# Patient Record
Sex: Female | Born: 1965 | Race: Black or African American | Hispanic: Yes | Smoking: Never smoker
Health system: Southern US, Community
[De-identification: ages and names within clinical notes are randomized; demographics above are authoritative.]

## PROBLEM LIST (undated history)

## (undated) DIAGNOSIS — D259 Leiomyoma of uterus, unspecified: Secondary | ICD-10-CM

## (undated) DIAGNOSIS — U071 COVID-19: Secondary | ICD-10-CM

## (undated) DIAGNOSIS — I4891 Unspecified atrial fibrillation: Secondary | ICD-10-CM

## (undated) DIAGNOSIS — D219 Benign neoplasm of connective and other soft tissue, unspecified: Secondary | ICD-10-CM

## (undated) DIAGNOSIS — Z8619 Personal history of other infectious and parasitic diseases: Secondary | ICD-10-CM

## (undated) HISTORY — PX: DIAGNOSTIC LAPAROSCOPY: SUR761

## (undated) HISTORY — DX: Personal history of other infectious and parasitic diseases: Z86.19

## (undated) HISTORY — PX: GANGLION CYST EXCISION: SHX1691

## (undated) HISTORY — DX: Benign neoplasm of connective and other soft tissue, unspecified: D21.9

## (undated) HISTORY — DX: Leiomyoma of uterus, unspecified: D25.9

---
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
## 1999-04-13 HISTORY — PX: MYOMECTOMY: SHX85

## 1999-04-13 HISTORY — PX: MYOMECTOMY ABDOMINAL APPROACH: SUR870

## 2000-04-12 HISTORY — PX: UTERINE FIBROID SURGERY: SHX826

## 2010-04-07 ENCOUNTER — Ambulatory Visit: Payer: Self-pay | Admitting: Psychology

## 2010-04-21 ENCOUNTER — Ambulatory Visit
Admission: RE | Admit: 2010-04-21 | Discharge: 2010-04-21 | Payer: Self-pay | Source: Home / Self Care | Attending: Psychology | Admitting: Psychology

## 2010-05-05 ENCOUNTER — Ambulatory Visit: Admit: 2010-05-05 | Payer: Self-pay | Admitting: Psychology

## 2010-05-19 ENCOUNTER — Ambulatory Visit (INDEPENDENT_AMBULATORY_CARE_PROVIDER_SITE_OTHER): Payer: BC Managed Care – PPO | Admitting: Psychology

## 2010-05-19 DIAGNOSIS — F4323 Adjustment disorder with mixed anxiety and depressed mood: Secondary | ICD-10-CM

## 2010-06-02 ENCOUNTER — Ambulatory Visit (INDEPENDENT_AMBULATORY_CARE_PROVIDER_SITE_OTHER): Payer: BC Managed Care – PPO | Admitting: Psychology

## 2010-06-02 DIAGNOSIS — F4323 Adjustment disorder with mixed anxiety and depressed mood: Secondary | ICD-10-CM

## 2010-06-16 ENCOUNTER — Ambulatory Visit (INDEPENDENT_AMBULATORY_CARE_PROVIDER_SITE_OTHER): Payer: BC Managed Care – PPO | Admitting: Psychology

## 2010-06-16 DIAGNOSIS — F4323 Adjustment disorder with mixed anxiety and depressed mood: Secondary | ICD-10-CM

## 2010-06-30 ENCOUNTER — Ambulatory Visit (INDEPENDENT_AMBULATORY_CARE_PROVIDER_SITE_OTHER): Payer: BC Managed Care – PPO | Admitting: Psychology

## 2010-06-30 DIAGNOSIS — F4323 Adjustment disorder with mixed anxiety and depressed mood: Secondary | ICD-10-CM

## 2010-09-09 ENCOUNTER — Encounter (INDEPENDENT_AMBULATORY_CARE_PROVIDER_SITE_OTHER): Payer: BC Managed Care – PPO | Admitting: Obstetrics & Gynecology

## 2010-09-09 DIAGNOSIS — Z01419 Encounter for gynecological examination (general) (routine) without abnormal findings: Secondary | ICD-10-CM

## 2010-09-09 DIAGNOSIS — Z3009 Encounter for other general counseling and advice on contraception: Secondary | ICD-10-CM

## 2010-09-09 DIAGNOSIS — Z1272 Encounter for screening for malignant neoplasm of vagina: Secondary | ICD-10-CM

## 2010-09-10 NOTE — Assessment & Plan Note (Signed)
[name], [name]:  [id number]  MEDICAL RECORD NO.:  [id number]           PATIENT TYPE:  LOCATION:  [facility] at [facility]           FACILITY:  PHYSICIAN:  Catalina Antigua, MD          DATE OF BIRTH:  DATE OF SERVICE:  09/09/2010                                 CLINIC NOTE  This is a 45 year old G1, P14 with LMP of Sep 04, 2010, who presents today for annual exam.  The patient is currently without any complaints. Denies abnormal bleeding or pelvic pain.  The patient recently relocated from [place] and interested in initiation of a birth control option. She is currently using condoms for birth control.  PAST MEDICAL HISTORY:  She denies.  PAST SURGICAL HISTORY:  She has had 1 C-section and an abdominal myomectomy in 2001.  OBSTETRICAL HISTORY:  She has had one full-term C-section.  PAST GYNECOLOGIC HISTORY:  She denies any abnormal Pap smears.  She does have a history of fibroid uterus treated with myomectomy.  FAMILY HISTORY:  Significant for hypertension in a grandmother who is deceased with an unknown type of cancer.  SOCIAL HISTORY:  She denies drinking, smoking, or use of illicit drugs.  REVIEW OF SYSTEMS:  Otherwise within normal limits.  PHYSICAL EXAMINATION:  VITAL SIGNS:  Her blood pressure is 122/87, pulse of 73, weight of 233 pounds, height of 5 feet 10 inches. LUNGS:  Clear to auscultation bilaterally. HEART:  Regular rate and rhythm. BREASTS:  Nontender, equal in size.  No palpable masses or lymphadenopathy.  No expressible nipple discharge. ABDOMEN:  Soft, nontender, nondistended, slightly obese. PELVIC:  External genitalia, normal-appearing vaginal mucosa and cervix. No abnormal bleeding or discharge.  She had an approximately 10-week size anteverted uterus.  No palpable adnexal masses or tenderness.  ASSESSMENT AND PLAN:  This is a 45 year old G1, P1 who presents today for annual exam and discussion of birth control options.  Pap  smear was performed today.  After counseling of the different birth control options, the patient opted for initiation of birth control pills that she was taking many years ago prior to her marriage and a prescription for Cyclessa was therefore provided.  The patient has no contraindication for birth control pills.  The patient is also contemplating the use of Mirena IUD.  The patient will therefore return in a year or p.r.n. and may contemplate Mirena IUD insertion at her next visit. The patient will also be contacted with any abnormal results.          ______________________________ Catalina Antigua, MD    PC/MEDQ  D:  09/09/2010  T:  09/09/2010  Job:  [id number]

## 2011-07-05 ENCOUNTER — Ambulatory Visit: Payer: BC Managed Care – PPO | Admitting: Family Medicine

## 2011-07-12 ENCOUNTER — Ambulatory Visit (INDEPENDENT_AMBULATORY_CARE_PROVIDER_SITE_OTHER): Payer: No Typology Code available for payment source | Admitting: Family Medicine

## 2011-07-12 ENCOUNTER — Encounter: Payer: Self-pay | Admitting: Family Medicine

## 2011-07-12 VITALS — BP 132/82 | HR 72 | Temp 98.8°F | Ht 70.0 in | Wt 222.2 lb

## 2011-07-12 DIAGNOSIS — Z0000 Encounter for general adult medical examination without abnormal findings: Secondary | ICD-10-CM | POA: Insufficient documentation

## 2011-07-12 DIAGNOSIS — Z0001 Encounter for general adult medical examination with abnormal findings: Secondary | ICD-10-CM | POA: Insufficient documentation

## 2011-07-12 LAB — LIPID PANEL
HDL: 60.7 mg/dL (ref >39.00)
Total CHOL/HDL Ratio: 4
VLDL: 16 mg/dL (ref 0.0–40.0)

## 2011-07-12 LAB — BASIC METABOLIC PANEL
Calcium: 9.2 mg/dL (ref 8.4–10.5)
GFR: 86.29 mL/min (ref >60.00)
Glucose, Bld: 82 mg/dL (ref 70–99)
Potassium: 4.1 mEq/L (ref 3.5–5.1)
Sodium: 139 mEq/L (ref 135–145)

## 2011-07-12 LAB — TSH: TSH: 0.98 u[IU]/mL (ref 0.35–5.50)

## 2011-07-12 LAB — LDL CHOLESTEROL, DIRECT: Direct LDL: 150.1 mg/dL

## 2011-07-12 NOTE — Progress Notes (Signed)
Subjective:    Patient ID: Breanna Weber, female    DOB: 01-25-1966, 46 y.o.   MRN: [id number]  HPI CC: new pt  Step-dad sick, attributes somewhat elevated bp to this.    Body mass index is 31.89 kg/(m^2).  LMP: 06/2011  Preventative: Last CPE 2011. Well woman exam - 2012, at [facility].  On OCPs. Normal paps and breast exam, normal mammograms. Flu shot - does not get. Tetanus - 2004. Fasting today. Seat belt 100% time.  Caffeine: none Lives with son Husband passed 2011. 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  Past histories reviewed and updated if relevant as below. There is no problem list on file for this patient.  Past Medical History  Diagnosis Date  . Uterine fibroid     removed  . History of chicken pox    Past Surgical History  Procedure Date  . Uterine fibroid surgery 2002  . Ganglion cyst excision x2   History  Substance Use Topics  . Smoking status: Never Smoker   . Smokeless tobacco: Never Used  . Alcohol Use: Yes     Occasional   Family History  Problem Relation Age of Onset  . Hypertension Mother   . Hypertension Maternal Grandmother   . Cancer Maternal Grandmother     ovarian cancer  . Stroke Maternal Grandmother   . Coronary artery disease Maternal Grandmother 65  . Diabetes Maternal Grandfather   . Hypertension Maternal Grandfather    Allergies  Allergen Reactions  . Aspirin Other (See Comments)    nosebleed   No current outpatient prescriptions on file prior to visit.    Review of Systems  Constitutional: Negative for fever, chills, activity change, appetite change, fatigue and unexpected weight change.  HENT: Negative for hearing loss and neck pain.   Eyes: Negative for visual disturbance.   Respiratory: Negative for cough, chest tightness, shortness of breath and wheezing.   Cardiovascular: Negative for chest pain, palpitations and leg swelling.  Gastrointestinal: Negative for nausea, vomiting, abdominal pain, diarrhea, constipation, blood in stool and abdominal distention.  Genitourinary: Negative for hematuria and difficulty urinating.  Musculoskeletal: Negative for myalgias and arthralgias.  Skin: Negative for rash.  Neurological: Negative for dizziness, seizures, syncope and headaches.  Hematological: Does not bruise/bleed easily.  Psychiatric/Behavioral: Negative for dysphoric mood. The patient is not nervous/anxious.        Objective:   Physical Exam  Nursing note and vitals reviewed. Constitutional: She is oriented to person, place, and time. She appears well-developed and well-nourished. No distress.  HENT:  Head: Normocephalic and atraumatic.  Right Ear: External ear normal.  Left Ear: External ear normal.  Nose: Nose normal.  Mouth/Throat: Oropharynx is clear and moist. No oropharyngeal exudate.  Eyes: Conjunctivae and EOM are normal. Pupils are equal, round, and reactive to light. No scleral icterus.  Neck: Normal range of motion. Neck supple. No thyromegaly present.  Cardiovascular: Normal rate, regular rhythm, normal heart sounds and intact distal pulses.   No murmur heard. Pulses:      Radial pulses are 2+ on the right side, and 2+ on the left side.  Pulmonary/Chest: Effort normal and breath sounds normal. No respiratory distress. She has no wheezes. She has no rales.  Abdominal: Soft. Bowel sounds are normal. She exhibits no distension and no mass. There  is no tenderness. There is no rebound and no guarding.  Musculoskeletal: Normal range of motion. She exhibits no edema.  Lymphadenopathy:    She has no cervical adenopathy.  Neurological: She is alert and oriented to person, place, and time.       CN grossly intact, station and gait intact  Skin: Skin  is warm and dry. No rash noted.  Psychiatric: She has a normal mood and affect. Her behavior is normal. Judgment and thought content normal.      Assessment & Plan:

## 2011-07-12 NOTE — Assessment & Plan Note (Signed)
Preventative protocols reviewed and updated unless pt declined. Tetanus next year. Fasting blood work today. Well woman with [facility]. rtc 2 years or as needed. Discussed healthy diet, living.

## 2011-07-12 NOTE — Patient Instructions (Signed)
Good to see you!  Call us with questions. Return in 2 years for next physical or as needed.

## 2011-08-13 ENCOUNTER — Other Ambulatory Visit: Payer: Self-pay | Admitting: Obstetrics and Gynecology

## 2011-10-19 ENCOUNTER — Ambulatory Visit (INDEPENDENT_AMBULATORY_CARE_PROVIDER_SITE_OTHER): Payer: No Typology Code available for payment source | Admitting: Family Medicine

## 2011-10-19 ENCOUNTER — Encounter: Payer: Self-pay | Admitting: Family Medicine

## 2011-10-19 VITALS — BP 123/84 | HR 90 | Ht 70.0 in | Wt 224.0 lb

## 2011-10-19 DIAGNOSIS — Z124 Encounter for screening for malignant neoplasm of cervix: Secondary | ICD-10-CM

## 2011-10-19 DIAGNOSIS — D219 Benign neoplasm of connective and other soft tissue, unspecified: Secondary | ICD-10-CM | POA: Insufficient documentation

## 2011-10-19 DIAGNOSIS — Z1151 Encounter for screening for human papillomavirus (HPV): Secondary | ICD-10-CM

## 2011-10-19 DIAGNOSIS — Z01419 Encounter for gynecological examination (general) (routine) without abnormal findings: Secondary | ICD-10-CM

## 2011-10-19 MED ORDER — DESOGEST-ETH ESTRAD TRIPHASIC 0.1/0.125/0.15 -0.025 MG PO TABS: 1.0000 | ORAL_TABLET | Freq: Every day | ORAL | Status: DC

## 2011-10-19 NOTE — Patient Instructions (Addendum)
Preventive Care for Adults, Female A healthy lifestyle and preventive care can promote health and wellness. Preventive health guidelines for women include the following key practices.  A routine yearly physical is a good way to check with your caregiver about your health and preventive screening. It is a chance to share any concerns and updates on your health, and to receive a thorough exam.   Visit your dentist for a routine exam and preventive care every 6 months. Brush your teeth twice a day and floss once a day. Good oral hygiene prevents tooth decay and gum disease.   The frequency of eye exams is based on your age, health, family medical history, use of contact lenses, and other factors. Follow your caregiver's recommendations for frequency of eye exams.   Eat a healthy diet. Foods like vegetables, fruits, whole grains, low-fat dairy products, and lean protein foods contain the nutrients you need without too many calories. Decrease your intake of foods high in solid fats, added sugars, and salt. Eat the right amount of calories for you.Get information about a proper diet from your caregiver, if necessary.   Regular physical exercise is one of the most important things you can do for your health. Most adults should get at least 150 minutes of moderate-intensity exercise (any activity that increases your heart rate and causes you to sweat) each week. In addition, most adults need muscle-strengthening exercises on 2 or more days a week.   Maintain a healthy weight. The body mass index (BMI) is a screening tool to identify possible weight problems. It provides an estimate of body fat based on height and weight. Your caregiver can help determine your BMI, and can help you achieve or maintain a healthy weight.For adults 20 years and older:   A BMI below 18.5 is considered underweight.   A BMI of 18.5 to 24.9 is normal.   A BMI of 25 to 29.9 is considered overweight.   A BMI of 30 and above is  considered obese.   Maintain normal blood lipids and cholesterol levels by exercising and minimizing your intake of saturated fat. Eat a balanced diet with plenty of fruit and vegetables. Blood tests for lipids and cholesterol should begin at age 20 and be repeated every 5 years. If your lipid or cholesterol levels are high, you are over 50, or you are at high risk for heart disease, you may need your cholesterol levels checked more frequently.Ongoing high lipid and cholesterol levels should be treated with medicines if diet and exercise are not effective.   If you smoke, find out from your caregiver how to quit. If you do not use tobacco, do not start.   If you are pregnant, do not drink alcohol. If you are breastfeeding, be very cautious about drinking alcohol. If you are not pregnant and choose to drink alcohol, do not exceed 1 drink per day. One drink is considered to be 12 ounces (355 mL) of beer, 5 ounces (148 mL) of wine, or 1.5 ounces (44 mL) of liquor.   Avoid use of street drugs. Do not share needles with anyone. Ask for help if you need support or instructions about stopping the use of drugs.   High blood pressure causes heart disease and increases the risk of stroke. Your blood pressure should be checked at least every 1 to 2 years. Ongoing high blood pressure should be treated with medicines if weight loss and exercise are not effective.   If you are 55 to 48   years old, ask your caregiver if you should take aspirin to prevent strokes.   Diabetes screening involves taking a blood sample to check your fasting blood sugar level. This should be done once every 3 years, after age 45, if you are within normal weight and without risk factors for diabetes. Testing should be considered at a younger age or be carried out more frequently if you are overweight and have at least 1 risk factor for diabetes.   Breast cancer screening is essential preventive care for women. You should practice "breast  self-awareness." This means understanding the normal appearance and feel of your breasts and may include breast self-examination. Any changes detected, no matter how small, should be reported to a caregiver. Women in their 20s and 30s should have a clinical breast exam (CBE) by a caregiver as part of a regular health exam every 1 to 3 years. After age 40, women should have a CBE every year. Starting at age 40, women should consider having a mammography (breast X-ray test) every year. Women who have a family history of breast cancer should talk to their caregiver about genetic screening. Women at a high risk of breast cancer should talk to their caregivers about having magnetic resonance imaging (MRI) and a mammography every year.   The Pap test is a screening test for cervical cancer. A Pap test can show cell changes on the cervix that might become cervical cancer if left untreated. A Pap test is a procedure in which cells are obtained and examined from the lower end of the uterus (cervix).   Women should have a Pap test starting at age 21.   Between ages 21 and 29, Pap tests should be repeated every 2 years.   Beginning at age 30, you should have a Pap test every 3 years as long as the past 3 Pap tests have been normal.   Some women have medical problems that increase the chance of getting cervical cancer. Talk to your caregiver about these problems. It is especially important to talk to your caregiver if a new problem develops soon after your last Pap test. In these cases, your caregiver may recommend more frequent screening and Pap tests.   The above recommendations are the same for women who have or have not gotten the vaccine for human papillomavirus (HPV).   If you had a hysterectomy for a problem that was not cancer or a condition that could lead to cancer, then you no longer need Pap tests. Even if you no longer need a Pap test, a regular exam is a good idea to make sure no other problems are  starting.   If you are between ages 65 and 70, and you have had normal Pap tests going back 10 years, you no longer need Pap tests. Even if you no longer need a Pap test, a regular exam is a good idea to make sure no other problems are starting.   If you have had past treatment for cervical cancer or a condition that could lead to cancer, you need Pap tests and screening for cancer for at least 20 years after your treatment.   If Pap tests have been discontinued, risk factors (such as a new sexual partner) need to be reassessed to determine if screening should be resumed.   The HPV test is an additional test that may be used for cervical cancer screening. The HPV test looks for the virus that can cause the cell changes on the cervix.   The cells collected during the Pap test can be tested for HPV. The HPV test could be used to screen women aged 30 years and older, and should be used in women of any age who have unclear Pap test results. After the age of 30, women should have HPV testing at the same frequency as a Pap test.   Colorectal cancer can be detected and often prevented. Most routine colorectal cancer screening begins at the age of 50 and continues through age 75. However, your caregiver may recommend screening at an earlier age if you have risk factors for colon cancer. On a yearly basis, your caregiver may provide home test kits to check for hidden blood in the stool. Use of a small camera at the end of a tube, to directly examine the colon (sigmoidoscopy or colonoscopy), can detect the earliest forms of colorectal cancer. Talk to your caregiver about this at age 50, when routine screening begins. Direct examination of the colon should be repeated every 5 to 10 years through age 75, unless early forms of pre-cancerous polyps or small growths are found.   Hepatitis C blood testing is recommended for all people born from 1945 through 1965 and any individual with known risks for hepatitis C.    Practice safe sex. Use condoms and avoid high-risk sexual practices to reduce the spread of sexually transmitted infections (STIs). STIs include gonorrhea, chlamydia, syphilis, trichomonas, herpes, HPV, and human immunodeficiency virus (HIV). Herpes, HIV, and HPV are viral illnesses that have no cure. They can result in disability, cancer, and death. Sexually active women aged 25 and younger should be checked for chlamydia. Older women with new or multiple partners should also be tested for chlamydia. Testing for other STIs is recommended if you are sexually active and at increased risk.   Osteoporosis is a disease in which the bones lose minerals and strength with aging. This can result in serious bone fractures. The risk of osteoporosis can be identified using a bone density scan. Women ages 65 and over and women at risk for fractures or osteoporosis should discuss screening with their caregivers. Ask your caregiver whether you should take a calcium supplement or vitamin D to reduce the rate of osteoporosis.   Menopause can be associated with physical symptoms and risks. Hormone replacement therapy is available to decrease symptoms and risks. You should talk to your caregiver about whether hormone replacement therapy is right for you.   Use sunscreen with sun protection factor (SPF) of 30 or more. Apply sunscreen liberally and repeatedly throughout the day. You should seek shade when your shadow is shorter than you. Protect yourself by wearing long sleeves, pants, a wide-brimmed hat, and sunglasses year round, whenever you are outdoors.   Once a month, do a whole body skin exam, using a mirror to look at the skin on your back. Notify your caregiver of new moles, moles that have irregular borders, moles that are larger than a pencil eraser, or moles that have changed in shape or color.   Stay current with required immunizations.   Influenza. You need a dose every fall (or winter). The composition of  the flu vaccine changes each year, so being vaccinated once is not enough.   Pneumococcal polysaccharide. You need 1 to 2 doses if you smoke cigarettes or if you have certain chronic medical conditions. You need 1 dose at age 65 (or older) if you have never been vaccinated.   Tetanus, diphtheria, pertussis (Tdap, Td). Get 1 dose of   Tdap vaccine if you are younger than age 65, are over 65 and have contact with an infant, are a healthcare worker, are pregnant, or simply want to be protected from whooping cough. After that, you need a Td booster dose every 10 years. Consult your caregiver if you have not had at least 3 tetanus and diphtheria-containing shots sometime in your life or have a deep or dirty wound.   HPV. You need this vaccine if you are a woman age 26 or younger. The vaccine is given in 3 doses over 6 months.   Measles, mumps, rubella (MMR). You need at least 1 dose of MMR if you were born in 1957 or later. You may also need a second dose.   Meningococcal. If you are age 19 to 21 and a first-year college student living in a residence hall, or have one of several medical conditions, you need to get vaccinated against meningococcal disease. You may also need additional booster doses.   Zoster (shingles). If you are age 60 or older, you should get this vaccine.   Varicella (chickenpox). If you have never had chickenpox or you were vaccinated but received only 1 dose, talk to your caregiver to find out if you need this vaccine.   Hepatitis A. You need this vaccine if you have a specific risk factor for hepatitis A virus infection or you simply wish to be protected from this disease. The vaccine is usually given as 2 doses, 6 to 18 months apart.   Hepatitis B. You need this vaccine if you have a specific risk factor for hepatitis B virus infection or you simply wish to be protected from this disease. The vaccine is given in 3 doses, usually over 6 months.  Preventive Services /  Frequency Ages 19 to 39  Blood pressure check.** / Every 1 to 2 years.   Lipid and cholesterol check.** / Every 5 years beginning at age 20.   Clinical breast exam.** / Every 3 years for women in their 20s and 30s.   Pap test.** / Every 2 years from ages 21 through 29. Every 3 years starting at age 30 through age 65 or 70 with a history of 3 consecutive normal Pap tests.   HPV screening.** / Every 3 years from ages 30 through ages 65 to 70 with a history of 3 consecutive normal Pap tests.   Hepatitis C blood test.** / For any individual with known risks for hepatitis C.   Skin self-exam. / Monthly.   Influenza immunization.** / Every year.   Pneumococcal polysaccharide immunization.** / 1 to 2 doses if you smoke cigarettes or if you have certain chronic medical conditions.   Tetanus, diphtheria, pertussis (Tdap, Td) immunization. / A one-time dose of Tdap vaccine. After that, you need a Td booster dose every 10 years.   HPV immunization. / 3 doses over 6 months, if you are 26 and younger.   Measles, mumps, rubella (MMR) immunization. / You need at least 1 dose of MMR if you were born in 1957 or later. You may also need a second dose.   Meningococcal immunization. / 1 dose if you are age 19 to 21 and a first-year college student living in a residence hall, or have one of several medical conditions, you need to get vaccinated against meningococcal disease. You may also need additional booster doses.   Varicella immunization.** / Consult your caregiver.   Hepatitis A immunization.** / Consult your caregiver. 2 doses, 6 to 18 months   apart.   Hepatitis B immunization.** / Consult your caregiver. 3 doses usually over 6 months.  Ages 40 to 64  Blood pressure check.** / Every 1 to 2 years.   Lipid and cholesterol check.** / Every 5 years beginning at age 20.   Clinical breast exam.** / Every year after age 40.   Mammogram.** / Every year beginning at age 40 and continuing for as  long as you are in good health. Consult with your caregiver.   Pap test.** / Every 3 years starting at age 30 through age 65 or 70 with a history of 3 consecutive normal Pap tests.   HPV screening.** / Every 3 years from ages 30 through ages 65 to 70 with a history of 3 consecutive normal Pap tests.   Fecal occult blood test (FOBT) of stool. / Every year beginning at age 50 and continuing until age 75. You may not need to do this test if you get a colonoscopy every 10 years.   Flexible sigmoidoscopy or colonoscopy.** / Every 5 years for a flexible sigmoidoscopy or every 10 years for a colonoscopy beginning at age 50 and continuing until age 75.   Hepatitis C blood test.** / For all people born from 1945 through 1965 and any individual with known risks for hepatitis C.   Skin self-exam. / Monthly.   Influenza immunization.** / Every year.   Pneumococcal polysaccharide immunization.** / 1 to 2 doses if you smoke cigarettes or if you have certain chronic medical conditions.   Tetanus, diphtheria, pertussis (Tdap, Td) immunization.** / A one-time dose of Tdap vaccine. After that, you need a Td booster dose every 10 years.   Measles, mumps, rubella (MMR) immunization. / You need at least 1 dose of MMR if you were born in 1957 or later. You may also need a second dose.   Varicella immunization.** / Consult your caregiver.   Meningococcal immunization.** / Consult your caregiver.   Hepatitis A immunization.** / Consult your caregiver. 2 doses, 6 to 18 months apart.   Hepatitis B immunization.** / Consult your caregiver. 3 doses, usually over 6 months.  Ages 65 and over  Blood pressure check.** / Every 1 to 2 years.   Lipid and cholesterol check.** / Every 5 years beginning at age 20.   Clinical breast exam.** / Every year after age 40.   Mammogram.** / Every year beginning at age 40 and continuing for as long as you are in good health. Consult with your caregiver.   Pap test.** /  Every 3 years starting at age 30 through age 65 or 70 with a 3 consecutive normal Pap tests. Testing can be stopped between 65 and 70 with 3 consecutive normal Pap tests and no abnormal Pap or HPV tests in the past 10 years.   HPV screening.** / Every 3 years from ages 30 through ages 65 or 70 with a history of 3 consecutive normal Pap tests. Testing can be stopped between 65 and 70 with 3 consecutive normal Pap tests and no abnormal Pap or HPV tests in the past 10 years.   Fecal occult blood test (FOBT) of stool. / Every year beginning at age 50 and continuing until age 75. You may not need to do this test if you get a colonoscopy every 10 years.   Flexible sigmoidoscopy or colonoscopy.** / Every 5 years for a flexible sigmoidoscopy or every 10 years for a colonoscopy beginning at age 50 and continuing until age 75.   Hepatitis   C blood test.** / For all people born from [birth year] through 1965 and any individual with known risks for hepatitis C.   Osteoporosis screening.** / A one-time screening for women ages 42 and over and women at risk for fractures or osteoporosis.   Skin self-exam. / Monthly.   Influenza immunization.** / Every year.   Pneumococcal polysaccharide immunization.** / 1 dose at age 24 (or older) if you have never been vaccinated.   Tetanus, diphtheria, pertussis (Tdap, Td) immunization. / A one-time dose of Tdap vaccine if you are over 65 and have contact with an infant, are a Research scientist (physical sciences), or simply want to be protected from whooping cough. After that, you need a Td booster dose every 10 years.   Varicella immunization.** / Consult your caregiver.   Meningococcal immunization.** / Consult your caregiver.   Hepatitis A immunization.** / Consult your caregiver. 2 doses, 6 to 18 months apart.   Hepatitis B immunization.** / Check with your caregiver. 3 doses, usually over 6 months.  ** Family history and personal history of risk and conditions may change your caregiver's  recommendations. Document Released: 05/25/2001 Document Revised: 03/18/2011 Document Reviewed: 08/24/2010 [facility] Patient Information 2012 [place], [place]. Fibroids You have been diagnosed as having a fibroid. Fibroids are smooth muscle lumps (tumors) which can occur any place in a woman's body. They are usually in the womb (uterus). The most common problem (symptom) of fibroids is bleeding. Over time this may cause low red blood cells (anemia). Other symptoms include feelings of pressure and pain in the pelvis. The diagnosis (learning what is wrong) of fibroids is made by physical exam. Sometimes tests such as an ultrasound are used. This is helpful when fibroids are felt around the ovaries and to look for tumors. TREATMENT   Most fibroids do not need surgical or medical treatment. Sometimes a tissue sample (biopsy) of the lining of the uterus is done to rule out cancer. If there is no cancer and only a small amount of bleeding, the problem can be watched.   Hormonal treatment can improve the problem.   When surgery is needed, it can consist of removing the fibroid. Vaginal birth may not be possible after the removal of fibroids. This depends on where they are and the extent of surgery. When pregnancy occurs with fibroids it is usually normal.   Your caregiver can help decide which treatments are best for you.  HOME CARE INSTRUCTIONS   Do not use aspirin as this may increase bleeding problems.   If your periods (menses) are heavy, record the number of pads or tampons used per month. Bring this information to your caregiver. This can help them determine the best treatment for you.  SEEK IMMEDIATE MEDICAL CARE IF:  You have pelvic pain or cramps not controlled with medications, or experience a sudden increase in pain.   You have an increase of pelvic bleeding between and during menses.   You feel lightheaded or have fainting spells.   You develop worsening belly (abdominal) pain.   Document Released: 03/26/2000 Document Revised: 03/18/2011 Document Reviewed: 11/16/2007 [facility] Patient Information 2012 [place], [place].

## 2011-10-19 NOTE — Progress Notes (Signed)
  Subjective:     Breanna Weber is a 46 y.o. female and is here for a comprehensive physical exam. The patient reports no problems.  History   Social History  . Marital Status: Widowed    Spouse Name: N/A    Number of Children: N/A  . Years of Education: N/A   Occupational History  . Not on file.   Social History Main Topics  . Smoking status: Never Smoker   . Smokeless tobacco: Never Used  . Alcohol Use: No     Occasional  . Drug Use: No  . Sexually Active: Yes -- Female partner(s)    Birth Control/ Protection: None   Other Topics Concern  . 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 Pap Smear  06/03/1981  . Tetanus/tdap  06/03/1982  . Influenza Vaccine  01/11/2012    The following portions of the patient's history were reviewed and updated as appropriate: allergies, current medications, past family history, past medical history, past social history, past surgical history and problem list.  Review of Systems A comprehensive review of systems was negative.   Objective:    BP 123/84  Pulse 90  Ht 5\' 10"  (1.778 m)  Wt 224 lb (101.606 kg)  BMI 32.14 kg/m2  LMP 10/05/2011 General appearance: alert, cooperative and appears stated age Neck: no adenopathy, supple, symmetrical, trachea midline and thyroid not enlarged, symmetric, no tenderness/mass/nodules Lungs: clear to auscultation bilaterally Breasts: normal appearance, no masses or tenderness Heart: regular rate and rhythm, S1, S2 normal, no murmur, click, rub or gallop Abdomen: soft, non-tender; bowel sounds normal; no masses,  no organomegaly Pelvic: cervix normal in appearance,  external genitalia normal, no adnexal masses or tenderness, no cervical motion tenderness, vagina normal without discharge and uterus is mildy firm and enlarged at 10 wks size Extremities: extremities normal, atraumatic, no cyanosis or edema Pulses: 2+ and symmetric Skin: Skin color, texture, turgor normal. No rashes or lesions Lymph nodes: Cervical, supraclavicular, and axillary nodes normal. Neurologic: Grossly normal    Assessment:    Healthy female exam.      Plan:    Pap smear, mammogram See After Visit Summary for Counseling Recommendations

## 2011-12-21 ENCOUNTER — Ambulatory Visit (INDEPENDENT_AMBULATORY_CARE_PROVIDER_SITE_OTHER): Payer: No Typology Code available for payment source | Admitting: Obstetrics & Gynecology

## 2011-12-21 ENCOUNTER — Encounter: Payer: Self-pay | Admitting: Obstetrics & Gynecology

## 2011-12-21 VITALS — BP 130/94 | HR 69 | Ht 70.0 in | Wt 223.0 lb

## 2011-12-21 DIAGNOSIS — Z01812 Encounter for preprocedural laboratory examination: Secondary | ICD-10-CM

## 2011-12-21 DIAGNOSIS — D219 Benign neoplasm of connective and other soft tissue, unspecified: Secondary | ICD-10-CM

## 2011-12-21 DIAGNOSIS — N949 Unspecified condition associated with female genital organs and menstrual cycle: Secondary | ICD-10-CM

## 2011-12-21 DIAGNOSIS — N938 Other specified abnormal uterine and vaginal bleeding: Secondary | ICD-10-CM

## 2011-12-21 DIAGNOSIS — D259 Leiomyoma of uterus, unspecified: Secondary | ICD-10-CM

## 2011-12-21 MED ORDER — MEDROXYPROGESTERONE ACETATE 10 MG PO TABS: 20.0000 mg | ORAL_TABLET | Freq: Every day | ORAL | Status: DC

## 2011-12-21 NOTE — Patient Instructions (Signed)

## 2011-12-21 NOTE — Progress Notes (Signed)
Patient has been bleeding since July and she is currently on ocp and was advised take two but this is not making a difference.    She does have a history of fibroids.

## 2011-12-21 NOTE — Progress Notes (Signed)
History:  46 y.o. [id number] here today for evaluation of AUB since 10/2011. She has self treated with OCPs, no alleviation of symptoms. History of fibroids s/p myomectomy in 2002.  Denies any lightheadedness, fatigue or other symptoms. Takes iron supplements.  The following portions of the patient's history were reviewed and updated as appropriate: allergies, current medications, past family history, past medical history, past social history, past surgical history and problem list. Normal pap in 10/2011.  Review of Systems:  Pertinent items are noted in HPI.  Objective:  Physical Exam Blood pressure 130/94, pulse 69, height 5\' 10"  (1.778 m), weight 223 lb (101.152 kg), last menstrual period 10/20/2011. Gen: NAD Abd: Soft, nontender and nondistended Pelvic: Normal appearing external genitalia; normal appearing vaginal mucosa and cervix.  Normal discharge.  Enlarged 10 week uterus, no other palpable masses, no uterine or adnexal tenderness  ENDOMETRIAL BIOPSY     The indications for endometrial biopsy were reviewed.   Risks of the biopsy including cramping, bleeding, infection, uterine perforation, inadequate specimen and need for additional procedures  were discussed. The patient states she understands and agrees to undergo procedure today. Consent was signed. Time out was performed. Urine HCG was negative. A sterile speculum was placed in the patient's vagina and the cervix was prepped with Betadine. A single-toothed tenaculum was placed on the anterior lip of the cervix to stabilize it. The 3 mm pipelle was introduced into the endometrial cavity without difficulty to a depth of 10 cm, and a moderate amount of tissue was obtained and sent to pathology. The instruments were removed from the patient's vagina. Minimal bleeding from the cervix was noted. The patient tolerated the procedure well. Routine post-procedure instructions were given to the patient. The patient will follow up to review the results  and for further management.     Assessment & Plan:  Will follow up endometrial biopsy and manage accordingly Pelvic ultrasound ordered Provera prescribed for AUB Bleeding precautions reviewed

## 2012-03-05 ENCOUNTER — Emergency Department: Payer: Self-pay | Admitting: Emergency Medicine

## 2012-03-05 LAB — RAPID INFLUENZA A&B ANTIGENS

## 2012-03-06 LAB — URINALYSIS, COMPLETE
Bilirubin,UR: NEGATIVE
Glucose,UR: NEGATIVE mg/dL (ref 0–75)
Leukocyte Esterase: NEGATIVE
RBC,UR: 2 /HPF (ref 0–5)
Specific Gravity: 1.021 (ref 1.003–1.030)
Squamous Epithelial: 1
WBC UR: 3 /HPF (ref 0–5)

## 2012-03-06 LAB — BASIC METABOLIC PANEL
Calcium, Total: 8.7 mg/dL (ref 8.5–10.1)
Chloride: 108 mmol/L — ABNORMAL HIGH (ref 98–107)
Co2: 22 mmol/L (ref 21–32)
Creatinine: 0.69 mg/dL (ref 0.60–1.30)
EGFR (African American): >60
Osmolality: 271 (ref 275–301)
Potassium: 4.1 mmol/L (ref 3.5–5.1)
Sodium: 137 mmol/L (ref 136–145)

## 2012-03-06 LAB — CBC
HGB: 12.8 g/dL (ref 12.0–16.0)
MCH: 31 pg (ref 26.0–34.0)
MCV: 89 fL (ref 80–100)
RBC: 4.14 10*6/uL (ref 3.80–5.20)

## 2012-03-06 LAB — MONONUCLEOSIS SCREEN: Mono Test: NEGATIVE

## 2012-03-06 LAB — TSH: Thyroid Stimulating Horm: 1.66 u[IU]/mL

## 2012-07-26 ENCOUNTER — Other Ambulatory Visit: Payer: Self-pay | Admitting: Family Medicine

## 2012-09-26 ENCOUNTER — Encounter: Payer: Self-pay | Admitting: Obstetrics & Gynecology

## 2012-09-26 ENCOUNTER — Ambulatory Visit (INDEPENDENT_AMBULATORY_CARE_PROVIDER_SITE_OTHER): Payer: BC Managed Care – PPO | Admitting: Obstetrics & Gynecology

## 2012-09-26 VITALS — BP 114/80 | HR 65 | Ht 70.0 in | Wt 227.0 lb

## 2012-09-26 DIAGNOSIS — N949 Unspecified condition associated with female genital organs and menstrual cycle: Secondary | ICD-10-CM

## 2012-09-26 DIAGNOSIS — N938 Other specified abnormal uterine and vaginal bleeding: Secondary | ICD-10-CM

## 2012-09-26 NOTE — Progress Notes (Signed)
Patient is here today for abnormal bleeding, she has been bleeding since 08/29/12.

## 2012-09-26 NOTE — Progress Notes (Signed)
  Subjective:    Patient ID: Breanna Weber, female    DOB: 08/18/1965, 46 y.o.   MRN: [id number]  HPI  54 W [id number] (9yo son) who is here for evaluation of DUB. She has known fibroids and was seen here 7..13 for DUB. She had a normal EMBX at that time and was given Provera for 10 days and then started on OCPs 9/13. She reports that her bleeding was fine until 5/14 when she started her period and has not stopped (spotting currently).   Review of Systems     Objective:   Physical Exam        Assessment & Plan:  DUB- check TSH, CBC, u/s

## 2012-09-27 LAB — CBC
HCT: 37.7 % (ref 36.0–46.0)
Hemoglobin: 12.2 g/dL (ref 12.0–15.0)
RDW: 13.5 % (ref 11.5–15.5)
WBC: 5.3 10*3/uL (ref 4.0–10.5)

## 2012-09-27 LAB — TSH: TSH: 1.366 u[IU]/mL (ref 0.350–4.500)

## 2012-10-05 ENCOUNTER — Encounter: Payer: Self-pay | Admitting: Obstetrics & Gynecology

## 2012-10-05 ENCOUNTER — Ambulatory Visit (INDEPENDENT_AMBULATORY_CARE_PROVIDER_SITE_OTHER): Payer: BC Managed Care – PPO | Admitting: Obstetrics & Gynecology

## 2012-10-05 VITALS — BP 137/84 | HR 60 | Ht 70.0 in | Wt 225.0 lb

## 2012-10-05 DIAGNOSIS — N949 Unspecified condition associated with female genital organs and menstrual cycle: Secondary | ICD-10-CM

## 2012-10-05 DIAGNOSIS — N938 Other specified abnormal uterine and vaginal bleeding: Secondary | ICD-10-CM
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
## 2012-10-06 LAB — GC/CHLAMYDIA PROBE AMP: CT Probe RNA: NEGATIVE

## 2012-10-06 LAB — HEPATITIS B SURFACE ANTIGEN: Hepatitis B Surface Ag: NEGATIVE

## 2012-10-06 LAB — HIV ANTIBODY (ROUTINE TESTING W REFLEX): HIV: NONREACTIVE

## 2012-10-06 LAB — HEPATITIS C ANTIBODY: HCV Ab: NEGATIVE

## 2013-05-08 ENCOUNTER — Encounter: Payer: Self-pay | Admitting: Family Medicine

## 2013-05-08 ENCOUNTER — Ambulatory Visit (INDEPENDENT_AMBULATORY_CARE_PROVIDER_SITE_OTHER): Payer: No Typology Code available for payment source | Admitting: Family Medicine

## 2013-05-08 VITALS — BP 124/82 | HR 72 | Temp 99.0°F | Wt 241.2 lb

## 2013-05-08 DIAGNOSIS — R5383 Other fatigue: Secondary | ICD-10-CM | POA: Insufficient documentation

## 2013-05-08 DIAGNOSIS — R5381 Other malaise: Secondary | ICD-10-CM

## 2013-05-08 DIAGNOSIS — R1032 Left lower quadrant pain: Secondary | ICD-10-CM

## 2013-05-08 LAB — COMPREHENSIVE METABOLIC PANEL
ALT: 13 U/L (ref 0–35)
AST: 13 U/L (ref 0–37)
Albumin: 3.6 g/dL (ref 3.5–5.2)
Alkaline Phosphatase: 79 U/L (ref 39–117)
BILIRUBIN TOTAL: 0.7 mg/dL (ref 0.3–1.2)
BUN: 11 mg/dL (ref 6–23)
CO2: 26 meq/L (ref 19–32)
CREATININE: 0.8 mg/dL (ref 0.4–1.2)
Calcium: 9.3 mg/dL (ref 8.4–10.5)
Chloride: 105 mEq/L (ref 96–112)
GFR: 80.72 mL/min (ref >60.00)
Glucose, Bld: 88 mg/dL (ref 70–99)
Potassium: 4 mEq/L (ref 3.5–5.1)
Sodium: 137 mEq/L (ref 135–145)
Total Protein: 8.3 g/dL (ref 6.0–8.3)

## 2013-05-08 LAB — CBC WITH DIFFERENTIAL/PLATELET
BASOS PCT: 0.4 % (ref 0.0–3.0)
Basophils Absolute: 0 10*3/uL (ref 0.0–0.1)
EOS PCT: 0.8 % (ref 0.0–5.0)
Eosinophils Absolute: 0.1 10*3/uL (ref 0.0–0.7)
HCT: 38.6 % (ref 36.0–46.0)
HEMOGLOBIN: 12.9 g/dL (ref 12.0–15.0)
LYMPHS PCT: 27.9 % (ref 12.0–46.0)
Lymphs Abs: 1.9 10*3/uL (ref 0.7–4.0)
MCHC: 33.3 g/dL (ref 30.0–36.0)
MCV: 89.4 fl (ref 78.0–100.0)
MONOS PCT: 6.3 % (ref 3.0–12.0)
Monocytes Absolute: 0.4 10*3/uL (ref 0.1–1.0)
NEUTROS ABS: 4.5 10*3/uL (ref 1.4–7.7)
NEUTROS PCT: 64.6 % (ref 43.0–77.0)
Platelets: 287 10*3/uL (ref 150.0–400.0)
RBC: 4.32 Mil/uL (ref 3.87–5.11)
RDW: 13.2 % (ref 11.5–14.6)
WBC: 7 10*3/uL (ref 4.5–10.5)

## 2013-05-08 LAB — POCT URINALYSIS DIPSTICK
GLUCOSE UA: NEGATIVE
KETONES UA: NEGATIVE
Leukocytes, UA: NEGATIVE
Nitrite, UA: NEGATIVE
RBC UA: NEGATIVE
Urobilinogen, UA: 0.2
pH, UA: 6

## 2013-05-08 LAB — TSH: TSH: 1.14 u[IU]/mL (ref 0.35–5.50)

## 2013-05-08 NOTE — Assessment & Plan Note (Addendum)
Correlating to increased fiber in diet - recommend back off fiber for next few days and increase water. Bland diet for 3 days. If persistent discomfort, consider diverticulitis. Check CMP and CBC today. Pt agrees with plan. iFOB today as well. Given frequency and mild suprapubic discomfort, checked UA today as well.

## 2013-05-08 NOTE — Addendum Note (Signed)
Addended by: Royann Shivers A on: 05/08/2013 11:04 AM   Modules accepted: Orders

## 2013-05-08 NOTE — Progress Notes (Signed)
   Subjective:    Patient ID: Breanna Weber, female    DOB: [birth year], 48 y.o.   MRN: [id number]  HPI CC: abd discomfort  Pleasant 48 yo not seen here since 07/2011 presents with concerns for fluid retention and fatigue. Has noticed 10-12 lb weight gain in the last month.  Feeling very tired/fatigued.  Having some right sided intermittent sharp chest discomfort and sharp LLQ discomfort over last several days which has since improved.  Mild nausea with emesis x1 last week.  Some increased frequency.  + PNdrainage.    Fatigue - staying with her.  Averaging 4-6 hours per night.  Denies blood loss from anywhere. Trying to eat healthier - more grains, wheats, nuts and proteins.  Has been increasing water intake as well. No fevers/chills, diarrhea/constipation, blood in stool.  No dysuria or urgency, hematuria.  No cough.  Denies significant dyspnea.  Denies leg swelling.  Wt Readings from Last 3 Encounters:  05/08/13 241 lb 4 oz (109.43 kg)  10/05/12 225 lb (102.059 kg)  09/26/12 227 lb (102.967 kg)  Body mass index is 34.62 kg/(m^2).   Recent eval for DUB by OBGYN last year revealing TSH 1.3, CBC WNL (09/2012).  Does have fmhx thyroid dysfunction.  Last well woman exam last year WNL (pap and pelvic and mammogram).  Past Medical History  Diagnosis Date  . Uterine fibroid     removed  . History of chicken pox   . Fibroids     Past Surgical History  Procedure Laterality Date  . Uterine fibroid surgery  2002  . Ganglion cyst excision  x2  . Myomectomy abdominal approach  2001  . Cesarean section  2004  . Myomectomy  2001    Family History  Problem Relation Age of Onset  . Hypertension Mother   . Asthma Mother   . Hypertension Maternal Grandmother   . Cancer Maternal Grandmother     ovarian cancer  . Stroke Maternal Grandmother   . Coronary artery disease Maternal Grandmother 65  . Diabetes Maternal Grandfather   . Hypertension Maternal Grandfather     Review of  Systems Per HPI    Objective:   Physical Exam  Nursing note and vitals reviewed. Constitutional: She appears well-developed and well-nourished. No distress.  HENT:  Head: Normocephalic and atraumatic.  Mouth/Throat: Oropharynx is clear and moist. No oropharyngeal exudate.  Neck: Normal range of motion. Neck supple. No thyromegaly present.  Cardiovascular: Normal rate, regular rhythm, normal heart sounds and intact distal pulses.   No murmur heard. Pulmonary/Chest: Effort normal and breath sounds normal. No respiratory distress. She has no wheezes. She has no rales.  Abdominal: Soft. Bowel sounds are normal. She exhibits no distension and no mass. There is no hepatosplenomegaly. There is tenderness (mild) in the suprapubic area. There is no rigidity, no rebound, no guarding, no CVA tenderness and negative Murphy's sign.  obese  Musculoskeletal: She exhibits no edema.  Lymphadenopathy:    She has no cervical adenopathy.  Skin: Skin is warm and dry. No rash noted.  Psychiatric: She has a normal mood and affect.       Assessment & Plan:

## 2013-05-08 NOTE — Assessment & Plan Note (Signed)
Anticipate related to inadequate amount of sleep. Check TSH, CBC today.

## 2013-05-08 NOTE — Patient Instructions (Addendum)
I wonder if this is too fast increase in fiber in diet.  Let's back off fiber for next few days and increase water.  Bland diet for next 2-3 days. Let me know if persistent symptoms or any worsening. Blood work today. Stool kit today - wait a week or two prior to completing. Goal 6-8 hours of sleep a day.

## 2013-05-08 NOTE — Progress Notes (Signed)
Pre-visit discussion using our clinic review tool. No additional management support is needed unless otherwise documented below in the visit note.  

## 2013-05-10 ENCOUNTER — Encounter: Payer: Self-pay | Admitting: *Deleted

## 2013-07-14 ENCOUNTER — Other Ambulatory Visit: Payer: Self-pay | Admitting: Family Medicine

## 2013-07-31 ENCOUNTER — Encounter: Payer: Self-pay | Admitting: Family Medicine

## 2013-07-31 ENCOUNTER — Ambulatory Visit (INDEPENDENT_AMBULATORY_CARE_PROVIDER_SITE_OTHER): Payer: No Typology Code available for payment source | Admitting: Family Medicine

## 2013-07-31 VITALS — BP 139/89 | HR 73 | Ht 70.0 in | Wt 239.0 lb

## 2013-07-31 DIAGNOSIS — Z124 Encounter for screening for malignant neoplasm of cervix: Secondary | ICD-10-CM

## 2013-07-31 DIAGNOSIS — Z01419 Encounter for gynecological examination (general) (routine) without abnormal findings: Secondary | ICD-10-CM

## 2013-07-31 DIAGNOSIS — Z1151 Encounter for screening for human papillomavirus (HPV): Secondary | ICD-10-CM

## 2013-07-31 NOTE — Patient Instructions (Signed)

## 2013-07-31 NOTE — Progress Notes (Signed)
Patient is here for yearly exam and needs to discuss changing her method of birth control due to her age.  She is currently on OCP.

## 2013-07-31 NOTE — Progress Notes (Signed)
  Subjective:     Breanna Weber is a 48 y.o. female and is here for a comprehensive physical exam. The patient reports no problems. She is on OC's.  Does not want kids.  Regular cycles.  No hot flashes.  Told to not stay on OC's after age 21.  History   Social History  . Marital Status: Widowed    Spouse Name: N/A    Number of Children: N/A  . Years of Education: N/A   Occupational History  . Not on file.   Social History Main Topics  . Smoking status: Never Smoker   . Smokeless tobacco: Never Used  . Alcohol Use: No     Comment: Occasional  . Drug Use: No  . Sexual Activity: Yes    Partners: Male    Birth Control/ Protection: Pill, None   Other Topics Concern  . Not on file   Social History Narrative   Caffeine: none   Lives with son   Husband died 26-Jul-2009 in [place], moved to be closer to family.   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 Tetanus/tdap  06/03/1982  . Mammogram  06/03/2013  . Colonoscopy  06/03/2013  . Influenza Vaccine  11/10/2013  . Pap Smear  10/19/2014    The following portions of the patient's history were reviewed and updated as appropriate: allergies, current medications, past family history, past medical history, past social history, past surgical history and problem list.  Review of Systems A comprehensive review of systems was negative.   Objective:    BP 139/89  Pulse 73  Ht 5\' 10"  (1.778 m)  Wt 239 lb (108.41 kg)  BMI 34.29 kg/m2  LMP 07/26/13 General appearance: alert, cooperative and appears stated age Head: Normocephalic, without obvious abnormality, atraumatic Eyes: negative findings: sclera without icterus Neck: no adenopathy, supple, symmetrical, trachea midline and thyroid not enlarged,  symmetric, no tenderness/mass/nodules Lungs: clear to auscultation bilaterally Breasts: normal appearance, no masses or tenderness Heart: regular rate and rhythm, S1, S2 normal, no murmur, click, rub or gallop Abdomen: soft, non-tender; bowel sounds normal; no masses,  no organomegaly Pelvic: cervix normal in appearance, external genitalia normal, no adnexal masses or tenderness, no cervical motion tenderness, uterus normal size, shape, and consistency and vagina normal without discharge Extremities: extremities normal, atraumatic, no cyanosis or edema Pulses: 2+ and symmetric Skin: Skin color, texture, turgor normal. No rashes or lesions Lymph nodes: Cervical, supraclavicular, and axillary nodes normal. Neurologic: Grossly normal    Assessment:    Healthy female exam. Stop OC's     Plan:    Pap smear today Mammogram in fall 2013-07-26. Labs with PCP Condoms for birth control.  See how cycles do without OC's. Required OC's for cycle control after negative EMB in 2013--consider testing for menopause vs. Progesterone only to control cycles. See After Visit Summary for Counseling Recommendations

## 2014-02-11 ENCOUNTER — Emergency Department: Payer: Self-pay | Admitting: Emergency Medicine

## 2014-02-11 ENCOUNTER — Telehealth: Payer: Self-pay | Admitting: Family Medicine

## 2014-02-11 ENCOUNTER — Encounter: Payer: Self-pay | Admitting: Family Medicine

## 2014-02-11 ENCOUNTER — Telehealth: Payer: Self-pay | Admitting: *Deleted

## 2014-02-11 LAB — BASIC METABOLIC PANEL WITH GFR
Anion Gap: 8
BUN: 13 mg/dL
Calcium, Total: 8.7 mg/dL
Chloride: 103 mmol/L
Co2: 28 mmol/L
Creatinine: 0.85 mg/dL
EGFR (African American): >60
EGFR (Non-African Amer.): >60
Glucose: 89 mg/dL
Osmolality: 277
Potassium: 3.7 mmol/L
Sodium: 139 mmol/L

## 2014-02-11 LAB — CBC
HCT: 40.2 % (ref 35.0–47.0)
HGB: 13.2 g/dL (ref 12.0–16.0)
MCH: 30 pg (ref 26.0–34.0)
MCHC: 32.9 g/dL (ref 32.0–36.0)
MCV: 91 fL (ref 80–100)
Platelet: 332 10*3/uL (ref 150–440)
RBC: 4.4 10*6/uL (ref 3.80–5.20)
RDW: 12.7 % (ref 11.5–14.5)
WBC: 9 10*3/uL (ref 3.6–11.0)

## 2014-02-11 LAB — TROPONIN I: Troponin-I: <0.02 ng/mL

## 2014-02-11 LAB — D-DIMER(ARMC): D-Dimer: 337 ng/mL

## 2014-02-11 NOTE — Telephone Encounter (Signed)
Patient Information:  Caller Name: Samanvi  Phone: [phone]  Patient: Breanna Weber, Breanna Weber  Gender: Female  DOB: 06/11/1965  Age: 48 Years  PCP: [facility])  Pregnant: No  Office Follow Up:  Does the office need to follow up with this patient?: No  Instructions For The Office: N/A  RN Note:  Spoke to nurse Kim sat the office and she advised to go to ED.  Symptoms  Reason For Call & Symptoms: Calling about occas chest pain in middle of chest--when moving shoulder or stretching. Has had chest pain x 1 month--thought she pulled a muscle in her chest.  Today she has a new sx of nausea and feels very anxious. Just got back from a long 8-11 hour car trip last week. Already has an appt scheduled for tomorrow @ 1030.  Reviewed Health History In EMR: Yes  Reviewed Medications In EMR: Yes  Reviewed Allergies In EMR: Yes  Reviewed Surgeries / Procedures: Yes  Date of Onset of Symptoms: 02/11/2014 OB / GYN:  LMP: 02/05/2014  Guideline(s) Used:  Chest Pain  Disposition Per Guideline:   Go to ED Now  Reason For Disposition Reached:   Pain also present in shoulder(s) or arm(s) or jaw  Advice Given:  N/A  Patient Will Follow Care Advice:  YES

## 2014-02-11 NOTE — Telephone Encounter (Addendum)
Cindy with [facility] called in reference to patient. Patient called with a complaint of CP intermittently x 1 month that radiates into her shoulder and gets worse with movement. However today she had an onset of the CP along with nausea and anxiousness which were new symptoms that followed a long car ride (8-11) last week. Jenny Reichmann wanted okay to send patient to ER. Okay given and patient also has appt scheduled with Dr. Damita Dunnings for tomorrow.

## 2014-02-11 NOTE — Telephone Encounter (Signed)
Noted. Agree.

## 2014-02-11 NOTE — Telephone Encounter (Signed)
Noted. Seeing Dr Damita Dunnings tomorrow. Kim if she doesn't come to appt tomorrow can we call for update?

## 2014-02-12 ENCOUNTER — Encounter: Payer: Self-pay | Admitting: Family Medicine

## 2014-02-12 ENCOUNTER — Ambulatory Visit (INDEPENDENT_AMBULATORY_CARE_PROVIDER_SITE_OTHER): Payer: No Typology Code available for payment source | Admitting: Family Medicine

## 2014-02-12 VITALS — BP 122/80 | HR 80 | Temp 98.8°F | Wt 245.0 lb

## 2014-02-12 DIAGNOSIS — R0789 Other chest pain: Secondary | ICD-10-CM

## 2014-02-12 NOTE — Patient Instructions (Signed)
You can try 2-3 OTC ibuprofen with food or an OTC liniment.  This should gradually get better.  Take care.

## 2014-02-12 NOTE — Telephone Encounter (Signed)
Patient saw Dr. Damita Dunnings today

## 2014-02-12 NOTE — Progress Notes (Signed)
Pre visit review using our clinic review tool, if applicable. No additional management support is needed unless otherwise documented below in the visit note.  To [facility] ER yesterday.  She had been lifting boxes.  Didn't know if she pulled a muscle.  She had some nausea episodically.  To ER yesterday.  R side of sternum,  with moving her R arm up.  No bruising.   Started about 3 weeks ago.  Not SOB.  No BLE edema usually, unless prolonged sitting.  Nausea is resolved now.   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    Per patient CXR and EKG, labs were unremarkable.  Requesting records.    She isn't taking ibuprofen or using other meds.  Hasn't tried any liniments yet.    Meds, vitals, and allergies reviewed.   ROS: See HPI.  Otherwise, noncontributory.  GEN: nad, alert and oriented NECK: supple w/o LA CV: rrr.  Chest wall ttp on R side w/o bruising.   PULM: ctab, no inc wob ABD: soft, +bs EXT: no edema SKIN: no acute rash

## 2014-02-13 DIAGNOSIS — R0789 Other chest pain: Secondary | ICD-10-CM | POA: Insufficient documentation

## 2014-02-13 NOTE — Assessment & Plan Note (Signed)
D/w pt.  Benign exam, as expected.  Reassured, this can be a nagging issue but should improve.  Can use OTC NSAID with GI cautions.  She agrees.

## 2014-03-27 ENCOUNTER — Encounter: Payer: Self-pay | Admitting: Family Medicine

## 2014-03-27 ENCOUNTER — Ambulatory Visit (INDEPENDENT_AMBULATORY_CARE_PROVIDER_SITE_OTHER): Payer: No Typology Code available for payment source | Admitting: Family Medicine

## 2014-03-27 VITALS — BP 124/82 | HR 88 | Temp 102.5°F | Wt 238.8 lb

## 2014-03-27 DIAGNOSIS — J029 Acute pharyngitis, unspecified: Secondary | ICD-10-CM

## 2014-03-27 DIAGNOSIS — J020 Streptococcal pharyngitis: Secondary | ICD-10-CM

## 2014-03-27 LAB — POCT RAPID STREP A (OFFICE): RAPID STREP A SCREEN: POSITIVE — AB

## 2014-03-27 MED ORDER — CEFTRIAXONE SODIUM 1 G IJ SOLR
2.0000 g | Freq: Once | INTRAMUSCULAR | Status: AC
  Administered 2014-03-27: 2 g via INTRAMUSCULAR

## 2014-03-27 MED ORDER — AMOXICILLIN 500 MG PO CAPS: 1000.0000 mg | ORAL_CAPSULE | Freq: Two times a day (BID) | ORAL | Status: DC

## 2014-03-27 NOTE — Assessment & Plan Note (Signed)
Treat with CTX 2gm IM today then amoxicillin course sent to pharmacy. No neck stiffness today, able to open mouth albeit painful.  Able to swallow small sips of fluids and ibuprofen in office today. Recheck here in 1-2 days if no better. Red flags to seek urgent care discussed. No current suppurative complication of strep pharyngitis.

## 2014-03-27 NOTE — Addendum Note (Signed)
Addended by: Royann Shivers A on: 03/27/2014 12:06 PM   Modules accepted: Orders

## 2014-03-27 NOTE — Progress Notes (Signed)
BP 124/82 mmHg  Pulse 88  Temp(Src) 102.5 F (39.2 C) (Oral)  Wt 238 lb 12 oz (108.296 kg)  LMP 03/08/2014   CC: ST  Subjective:    Patient ID: Breanna Weber, female    DOB: Nov 25, 1965, 48 y.o.   MRN: [id number]  HPI: Breanna Weber is a 48 y.o. female presenting on 03/27/2014 for Sore Throat   2d h/o ST, swelling, trouble swallowing 2/2. + body aches. Nauseated Monday night. Unable to eat anything, just drinking sips of water and tea.  No cough ear or tooth pain. No headaches, abd pain, vomiting.  Last tylenol at [facility].   Son with h/o recurrent strep throats but currently not infected.  Relevant past medical, surgical, family and social history reviewed and updated as indicated. Interim medical history since our last visit reviewed. Allergies and medications reviewed and updated.  No current outpatient prescriptions on file prior to visit.   No current facility-administered medications on file prior to visit.    Review of Systems Per HPI unless specifically indicated above     Objective:    BP 124/82 mmHg  Pulse 88  Temp(Src) 102.5 F (39.2 C) (Oral)  Wt 238 lb 12 oz (108.296 kg)  LMP 03/08/2014  Wt Readings from Last 3 Encounters:  03/27/14 238 lb 12 oz (108.296 kg)  02/12/14 245 lb (111.131 kg)  07/31/13 239 lb (108.41 kg)    Physical Exam  Constitutional: She appears well-developed and well-nourished. No distress.  HENT:  Head: Normocephalic and atraumatic.  Right Ear: Hearing, tympanic membrane, external ear and ear canal normal.  Left Ear: Hearing, external ear and ear canal normal.  Nose: No mucosal edema or rhinorrhea. Right sinus exhibits no maxillary sinus tenderness and no frontal sinus tenderness. Left sinus exhibits no maxillary sinus tenderness and no frontal sinus tenderness.  Mouth/Throat: Uvula is midline and mucous membranes are normal. Oropharyngeal exudate, posterior oropharyngeal edema and posterior oropharyngeal erythema present. No  tonsillar abscesses.  L TM erythematous, congested Erythematous swollen tonsils R tonsil with marked exudates  Eyes: Conjunctivae and EOM are normal. Pupils are equal, round, and reactive to light. No scleral icterus.  Neck: Normal range of motion. Neck supple.  Cardiovascular: Regular rhythm, normal heart sounds and intact distal pulses.  Tachycardia present.   No murmur heard. Pulmonary/Chest: Effort normal and breath sounds normal. No respiratory distress. She has no wheezes. She has no rales.  Lymphadenopathy:    She has cervical adenopathy (bilat AC LAD).  Skin: Skin is warm and dry. No rash noted.  Nursing note and vitals reviewed.  Results for orders placed or performed in visit on 03/27/14  POCT rapid strep A  Result Value Ref Range   Rapid Strep A Screen Positive (A) Negative      Assessment & Plan:   Problem List Items Addressed This Visit    Acute streptococcal pharyngitis - Primary    Treat with CTX 2gm IM today then amoxicillin course sent to pharmacy. No neck stiffness today, able to open mouth albeit painful.  Able to swallow small sips of fluids and ibuprofen in office today. Recheck here in 1-2 days if no better. Red flags to seek urgent care discussed. No current suppurative complication of strep pharyngitis.     Other Visit Diagnoses    Sore throat        Relevant Orders       POCT rapid strep A (Completed)        Follow up plan: Return if  symptoms worsen or fail to improve.

## 2014-03-27 NOTE — Patient Instructions (Signed)
You have strep throat - we gave you a shot of antibiotics today (rocephin 2gm) and ibuprofen 600mg  for inflammation. Continue ibuprofen 600mg  twice daily for throat inflammation and fever. Start amoxicillin course sent to pharmacy for next 10 days. If not improving - if worsening pain/swelling of throat or persistent trouble swallowing or persistent fever past 24 hours of antibiotic please return to see [place] or seek urgent care.  Strep Throat Strep throat is an infection of the throat caused by a bacteria named Streptococcus pyogenes. Your health care provider may call the infection streptococcal "tonsillitis" or "pharyngitis" depending on whether there are signs of inflammation in the tonsils or back of the throat. Strep throat is most common in children aged 5-15 years during the cold months of the year, but it can occur in people of any age during any season. This infection is spread from person to person (contagious) through coughing, sneezing, or other close contact. SIGNS AND SYMPTOMS   Fever or chills.  Painful, swollen, red tonsils or throat.  Pain or difficulty when swallowing.  White or yellow spots on the tonsils or throat.  Swollen, tender lymph nodes or "glands" of the neck or under the jaw.  Red rash all over the body (rare). DIAGNOSIS  Many different infections can cause the same symptoms. A test must be done to confirm the diagnosis so the right treatment can be given. A "rapid strep test" can help your health care provider make the diagnosis in a few minutes. If this test is not available, a light swab of the infected area can be used for a throat culture test. If a throat culture test is done, results are usually available in a day or two. TREATMENT  Strep throat is treated with antibiotic medicine. HOME CARE INSTRUCTIONS   Gargle with 1 tsp of salt in 1 cup of warm water, 3-4 times per day or as needed for comfort.  Family members who also have a sore throat or fever  should be tested for strep throat and treated with antibiotics if they have the strep infection.  Make sure everyone in your household washes their hands well.  Do not share food, drinking cups, or personal items that could cause the infection to spread to others.  You may need to eat a soft food diet until your sore throat gets better.  Drink enough water and fluids to keep your urine clear or pale yellow. This will help prevent dehydration.  Get plenty of rest.  Stay home from school, day care, or work until you have been on antibiotics for 24 hours.  Take medicines only as directed by your health care provider.  Take your antibiotic medicine as directed by your health care provider. Finish it even if you start to feel better. SEEK MEDICAL CARE IF:   The glands in your neck continue to enlarge.  You develop a rash, cough, or earache.  You cough up green, yellow-brown, or bloody sputum.  You have pain or discomfort not controlled by medicines.  Your problems seem to be getting worse rather than better.  You have a fever. SEEK IMMEDIATE MEDICAL CARE IF:   You develop any new symptoms such as vomiting, severe headache, stiff or painful neck, chest pain, shortness of breath, or trouble swallowing.  You develop severe throat pain, drooling, or changes in your voice.  You develop swelling of the neck, or the skin on the neck becomes red and tender.  You develop signs of dehydration, such as  fatigue, dry mouth, and decreased urination.  You become increasingly sleepy, or you cannot wake up completely. MAKE SURE YOU:  Understand these instructions.  Will watch your condition.  Will get help right away if you are not doing well or get worse. Document Released: 03/26/2000 Document Revised: 08/13/2013 Document Reviewed: 05/28/2010 [facility] Patient Information 2015 [place], [place]. This information is not intended to replace advice given to you by your health care provider.  Make sure you discuss any questions you have with your health care provider.

## 2014-03-27 NOTE — Progress Notes (Signed)
Pre visit review using our clinic review tool, if applicable. No additional management support is needed unless otherwise documented below in the visit note. 

## 2014-04-22 ENCOUNTER — Telehealth: Payer: Self-pay

## 2014-04-22 NOTE — Telephone Encounter (Signed)
plz call to see where she went and how she is doing - out of town, was recommended [facility] by [facility].

## 2014-04-22 NOTE — Telephone Encounter (Signed)
PLEASE NOTE: All timestamps contained within this report are represented as [place] Standard Time. CONFIDENTIALTY NOTICE: This fax transmission is intended only for the addressee. It contains information that is legally privileged, confidential or otherwise protected from use or disclosure. If you are not the intended recipient, you are strictly prohibited from reviewing, disclosing, copying using or disseminating any of this information or taking any action in reliance on or regarding this information. If you have received this fax in error, please notify us immediately by telephone so that we can arrange for its return to [place]. Phone: [phone], Toll-Free: [phone], Fax: [phone] Page: 1 of 2 Call Id: [id number] [place] Patient Name: Breanna Weber Gender: Female DOB: [birth year] Age: 49 Y 10 M 18 D Return Phone Number: [phone] (Primary) Address: City/State/Zip: [place] [address] [employer] - Client Client Site [place] - Night Contact Type Call Call Type Triage / Clinical Relationship To Patient Self Return Phone Number [phone] (Primary) Chief Complaint Sore Throat Initial Comment Caller states sore throat need an antibiotic PreDisposition Call Doctor Nurse Assessment Nurse: Rock Nephew, RN, Juliann Pulse Date/Time (Eastern Time): 04/20/2014 8:22:49 AM Confirm and document reason for call. If symptomatic, describe symptoms. ---Caller states she has a sore throat and needs an antibiotic called in. Has the patient traveled out of the country within the last 30 days? ---Not Applicable Does the patient require triage? ---Yes Related visit to physician within the last 2 weeks? ---Yes was treated for Strep Throat and finished antibiotics last week Does the PT have any chronic conditions? (i.e. diabetes, asthma,  etc.) ---No Did the patient indicate they were pregnant? ---No Guidelines Guideline Title Affirmed Question Affirmed Notes Nurse Date/Time Eilene Ghazi Time) Sore Throat SEVERE (e.g., excruciating) throat pain Wells, RN, Juliann Pulse 04/20/2014 8:24:20 AM Disp. Time Eilene Ghazi Time) Disposition Final User 04/20/2014 8:26:24 AM See Physician within 24 Hours Yes Rock Nephew, RN, Gara Kroner Understands: Yes Disagree/Comply: Comply Care Advice Given Per Guideline SEE PHYSICIAN WITHIN 24 HOURS: SORE THROAT - For relief of sore throat: * Sip warm chicken broth or apple juice. * Gargle with warm salt water four times a day. To make salt water, put 1/2 teaspoon of salt in 8 oz (240 ml) of warm water. SOFT DIET: * Eat a soft diet. Cold drinks, popsicles, and milk shakes are especially good. Avoid citrus fruits. * Drink plenty of liquids so PLEASE NOTE: All timestamps contained within this report are represented as [place] Standard Time. CONFIDENTIALTY NOTICE: This fax transmission is intended only for the addressee. It contains information that is legally privileged, confidential or otherwise protected from use or disclosure. If you are not the intended recipient, you are strictly prohibited from reviewing, disclosing, copying using or disseminating any of this information or taking any action in reliance on or regarding this information. If you have received this fax in error, please notify us immediately by telephone so that we can arrange for its return to [place]. Phone: [phone], Toll-Free: [phone], Fax: [phone] Page: 2 of 2 Call Id: [id number] Care Advice Given Per Guideline as to avoid dehydration (8-12 eight oz glasses each day). CALL BACK IF: * You become worse. CARE ADVICE given per Sore Throat (Adult) guideline. * IF OFFICE WILL BE CLOSED AND NO PCP TRIAGE: You need to be examined within the next 24 hours. Go to _________ at your convenience. After Care Instructions Given Call Event Type User Date /  Time Description Comments User: Valetta Mole, RN Date/Time Eilene Ghazi Time): 04/20/2014 8:28:05 AM caller out of town unsure which urgent care she will go to Referrals [place] UNDECIDED

## 2014-04-22 NOTE — Telephone Encounter (Signed)
Spoke with patient. She did not go to [facility]. She said she is feeling better. She just took ibuprofen and it helped.

## 2014-04-26 ENCOUNTER — Emergency Department: Payer: Self-pay | Admitting: Emergency Medicine

## 2014-04-26 LAB — URINALYSIS, COMPLETE
BILIRUBIN, UR: NEGATIVE
BLOOD: NEGATIVE
GLUCOSE, UR: NEGATIVE mg/dL (ref 0–75)
Ketone: NEGATIVE
NITRITE: NEGATIVE
Ph: 5 (ref 4.5–8.0)
Protein: NEGATIVE
RBC,UR: 6 /HPF (ref 0–5)
SPECIFIC GRAVITY: 1.023 (ref 1.003–1.030)

## 2014-04-26 LAB — PREGNANCY, URINE: Pregnancy Test, Urine: NEGATIVE m[IU]/mL

## 2014-04-30 ENCOUNTER — Telehealth: Payer: Self-pay

## 2014-04-30 NOTE — Telephone Encounter (Signed)
PLEASE NOTE: All timestamps contained within this report are represented as [place] Standard Time. CONFIDENTIALTY NOTICE: This fax transmission is intended only for the addressee. It contains information that is legally privileged, confidential or otherwise protected from use or disclosure. If you are not the intended recipient, you are strictly prohibited from reviewing, disclosing, copying using or disseminating any of this information or taking any action in reliance on or regarding this information. If you have received this fax in error, please notify us immediately by telephone so that we can arrange for its return to [place]. Phone: [phone], Toll-Free: [phone], Fax: [phone] Page: 1 of 2 Call Id: [id number] [place] Patient Name: Breanna Weber Gender: Female DOB: [birth year] Age: 49 Y 10 M 28 D Return Phone Number: [phone] (Primary) Address: [address] City/State/Zip: [place] [facility] [address] Client [place] [facility] - Client Client Site [place] Physician Ria Bush Contact Type Call Call Type Triage / Clinical Relationship To Patient Self Appointment Disposition EMR Appointment Not Necessary Return Phone Number [phone] (Primary) Chief Complaint Medication Question (non symptomatic) Initial Comment Caller states has a medication question. Nurse Assessment Nurse: Dimas Chyle, RN, Dellis Filbert Date/Time Eilene Ghazi Time): 04/30/2014 5:14:04 PM Confirm and document reason for call. If symptomatic, describe symptoms. ---Caller states has a medication question. Has been taking cipro for UTI since 1/15 and is having symptoms related to medication. Says she doesn't feel right and is having some swelling on her bottom lip. Has the patient traveled out of the country within the last 30 days? ---Not Applicable Does the patient  require triage? ---No Please document clinical information provided and list any resource used. ---You should check with your doctor immediately if any of these side effects occur when taking ciprofloxacin: More common .Diarrhea Rare .burning, crawling, itching, numbness, prickling, "pins and needles", or tingling feelings .nausea and vomiting .puffiness or swelling of the eyelids or around the eyes, face, lips, or tongue .swelling of the face, feet, or lower legs .unusual drowsiness, dullness, tiredness, weakness, or feeling of sluggishness Advised caller to hold off on taking until she contacts office due to symptoms of possible allergic reaction. Guidelines Guideline Title Affirmed Question Affirmed Notes Nurse Date/Time (Eastern Time) Disp. Time Eilene Ghazi Time) Disposition Final User 04/30/2014 5:19:09 PM Clinical Call Yes Dimas Chyle, RN, Dellis Filbert After Care Instructions Given Call Event Type User Date / Time Description PLEASE NOTE: All timestamps contained within this report are represented as [place] Standard Time. CONFIDENTIALTY NOTICE: This fax transmission is intended only for the addressee. It contains information that is legally privileged, confidential or otherwise protected from use or disclosure. If you are not the intended recipient, you are strictly prohibited from reviewing, disclosing, copying using or disseminating any of this information or taking any action in reliance on or regarding this information. If you have received this fax in error, please notify us immediately by telephone so that we can arrange for its return to [place]. Phone: [phone], Toll-Free: [phone], Fax: [phone] Page: 2 of 2 Call Id: [id number] Comments User: Georga Bora, RN Date/Time Eilene Ghazi Time): 04/30/2014 5:19:47 PM Reference for side effects from [facility]

## 2014-05-01 NOTE — Telephone Encounter (Signed)
Spoke with patient and she said she has already stopped the cipro. She said her UTI symptoms have subsided and she took 4 days of it. She went to [facility], not [facility]. She will call to see if they did a culture and see if she needs to continue treatment or if it's okay to stop abx. If symptoms return, she will call here. I have requested records.

## 2014-05-01 NOTE — Telephone Encounter (Signed)
Agree. Recommend stopping cipro and have updated allergy list.. How many more days did she have?  Would contact [facility] where she had UTI to see if they sent culture to change abx. If not let us know and we can change abx if persistent sxs.

## 2014-05-09 NOTE — Telephone Encounter (Signed)
Records received, asked to scan. UCx not done.

## 2014-05-13 ENCOUNTER — Encounter: Payer: Self-pay | Admitting: Family Medicine

## 2015-04-13 LAB — HM MAMMOGRAPHY

## 2015-05-12 ENCOUNTER — Encounter: Payer: Self-pay | Admitting: Family Medicine

## 2015-05-12 ENCOUNTER — Ambulatory Visit (INDEPENDENT_AMBULATORY_CARE_PROVIDER_SITE_OTHER): Payer: BLUE CROSS/BLUE SHIELD | Admitting: Family Medicine

## 2015-05-12 VITALS — BP 120/70 | HR 82 | Temp 98.6°F | Ht 70.0 in | Wt 243.0 lb

## 2015-05-12 DIAGNOSIS — E785 Hyperlipidemia, unspecified: Secondary | ICD-10-CM | POA: Insufficient documentation

## 2015-05-12 DIAGNOSIS — D259 Leiomyoma of uterus, unspecified: Secondary | ICD-10-CM

## 2015-05-12 DIAGNOSIS — Z1211 Encounter for screening for malignant neoplasm of colon: Secondary | ICD-10-CM

## 2015-05-12 DIAGNOSIS — H547 Unspecified visual loss: Secondary | ICD-10-CM | POA: Insufficient documentation

## 2015-05-12 DIAGNOSIS — Z0000 Encounter for general adult medical examination without abnormal findings: Secondary | ICD-10-CM

## 2015-05-12 DIAGNOSIS — Z0001 Encounter for general adult medical examination with abnormal findings: Secondary | ICD-10-CM

## 2015-05-12 DIAGNOSIS — E669 Obesity, unspecified: Secondary | ICD-10-CM | POA: Diagnosis not present

## 2015-05-12 DIAGNOSIS — M25512 Pain in left shoulder: Secondary | ICD-10-CM | POA: Insufficient documentation

## 2015-05-12 NOTE — Assessment & Plan Note (Signed)
Recheck FLP today. Off meds. 

## 2015-05-12 NOTE — Assessment & Plan Note (Signed)
Preventative protocols reviewed and updated unless pt declined. Discussed healthy diet and lifestyle.  

## 2015-05-12 NOTE — Assessment & Plan Note (Signed)
Failed snellen today - rec eye exam.

## 2015-05-12 NOTE — Assessment & Plan Note (Signed)
Discussed healthy diet and lifestyle changes to affect sustainable weight loss  

## 2015-05-12 NOTE — Progress Notes (Signed)
BP 120/70 mmHg  Pulse 82  Temp(Src) 98.6 F (37 C) (Oral)  Ht 5' 10"  (1.778 m)  Wt 243 lb (110.224 kg)  BMI 34.87 kg/m2  SpO2 97%   CC: CPE  Subjective:    Patient ID: Breanna Weber, female    DOB: Sep 03, 1965, 50 y.o.   MRN: [id number]  HPI: Breanna Weber is a 50 y.o. female presenting on 05/12/2015 for Annual Exam   L shoulder pain over last 2-3 months since basketball game with son and nephew, slowly improving.  Preventative: Colon cancer screening - did not complete stool kit. Requests again. Well woman exam - 07/2013 Dr Kennon Rounds, pap normal (endocervical/transformation zone absent), HPV neg. Off OCPs. H/o fibroids. Periods irregular over last year. LMP: 02/2015 Mammogram done 2015 at [facility] imaging. Flu shot - does not get. Tetanus - 2004. Seat belt 100% time. No changing moles on skin.  Caffeine: none Lives with son Husband passed 2011. 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 Interim medical history since our last visit reviewed. Allergies and medications reviewed and updated. No current outpatient prescriptions on file prior to visit.   No current facility-administered medications on file prior to visit.    Review of Systems  Constitutional: Negative for fever, chills, activity change, appetite change, fatigue and unexpected weight change.  HENT: Positive for congestion and rhinorrhea. Negative for hearing loss.        Recent cold  Eyes: Positive for visual disturbance.  Respiratory: Positive for cough. Negative for chest tightness, shortness of breath and wheezing.   Cardiovascular: Negative for chest pain, palpitations and leg swelling.  Gastrointestinal: Negative  for nausea, vomiting, abdominal pain, diarrhea, constipation, blood in stool and abdominal distention.  Genitourinary: Negative for hematuria and difficulty urinating.  Musculoskeletal: Negative for myalgias, arthralgias and neck pain.  Skin: Negative for rash.  Neurological: Negative for dizziness, seizures, syncope and headaches.  Hematological: Negative for adenopathy. Does not bruise/bleed easily.  Psychiatric/Behavioral: Negative for dysphoric mood. The patient is not nervous/anxious.    Per HPI unless specifically indicated in ROS section     Objective:    BP 120/70 mmHg  Pulse 82  Temp(Src) 98.6 F (37 C) (Oral)  Ht 5' 10"  (1.778 m)  Wt 243 lb (110.224 kg)  BMI 34.87 kg/m2  SpO2 97%  Wt Readings from Last 3 Encounters:  05/12/15 243 lb (110.224 kg)  03/27/14 238 lb 12 oz (108.296 kg)  02/12/14 245 lb (111.131 kg)    Physical Exam  Constitutional: She is oriented to person, place, and time. She appears well-developed and well-nourished. No distress.  HENT:  Head: Normocephalic and atraumatic.  Right Ear: Hearing, tympanic membrane, external ear and ear canal normal.  Left Ear: Hearing, tympanic membrane, external ear and ear canal normal.  Nose: Nose normal.  Mouth/Throat: Uvula is midline, oropharynx is clear and moist and mucous membranes are normal. No oropharyngeal exudate, posterior oropharyngeal edema or posterior oropharyngeal erythema.  Eyes: Conjunctivae and EOM are normal. Pupils are equal, round, and reactive to light. No scleral icterus.  Neck: Normal range of motion. Neck supple. No thyromegaly present.  Cardiovascular: Normal rate, regular rhythm, normal heart sounds and intact distal pulses.   No murmur heard. Pulses:      Radial  pulses are 2+ on the right side, and 2+ on the left side.  Pulmonary/Chest: Effort normal and breath sounds normal. No respiratory distress. She has no wheezes. She has no rales.  Abdominal: Soft. Bowel sounds are normal. She  exhibits no distension and no mass. There is no tenderness. There is no rebound and no guarding.  Musculoskeletal: Normal range of motion. She exhibits no edema.  R shoulder WNL L Shoulder exam: No deformity of shoulders on inspection. No pain with palpation of shoulder landmarks. FROM in abduction and forward flexion. Mild pain with testing SITS in int rotation. No pain with empty can sign. Neg Yerguson, Speed test. No impingement. No pain with crossover test. Mild discomfort with rotation of humeral head in [place] joint.   Lymphadenopathy:    She has no cervical adenopathy.  Neurological: She is alert and oriented to person, place, and time.  CN grossly intact, station and gait intact  Skin: Skin is warm and dry. No rash noted.  Psychiatric: She has a normal mood and affect. Her behavior is normal. Judgment and thought content normal.  Nursing note and vitals reviewed.     Assessment & Plan:   Problem List Items Addressed This Visit    Obesity, Class I, BMI 30-34.9    Discussed healthy diet and lifestyle changes to affect sustainable weight loss.      Relevant Orders   Lipid panel   TSH   Basic metabolic panel   Left shoulder pain    Anticipate RTC irritation - as improving rec no change for now. Discussed NSAID use and provided with rehab exercises.      HLD (hyperlipidemia)    Recheck FLP today. Off meds.      Relevant Orders   Lipid panel   TSH   Basic metabolic panel   Healthcare maintenance - Primary    Preventative protocols reviewed and updated unless pt declined. Discussed healthy diet and lifestyle.       Fibroids   Relevant Orders   CBC with Differential/Platelet   Decreased visual acuity    Failed snellen today - rec eye exam.       Other Visit Diagnoses    Special screening for malignant neoplasms, colon        Relevant Orders    Fecal occult blood, imunochemical        Follow up plan: Return in about 1 year (around 05/11/2016), or as needed,  for annual exam, prior fasting for blood work.

## 2015-05-12 NOTE — Assessment & Plan Note (Addendum)
Anticipate RTC irritation - as improving rec no change for now. Discussed NSAID use and provided with rehab exercises.

## 2015-05-12 NOTE — Progress Notes (Signed)
Pre visit review using our clinic review tool, if applicable. No additional management support is needed unless otherwise documented below in the visit note. 

## 2015-05-12 NOTE — Patient Instructions (Addendum)
Sign release form for records of last mammogram [facility] imaging [place].  Pass by lab for stool kit.  Visual acuity test today. Blood work today. Try rotator cuff exercises provided today.  Return as needed or in 1 year for next physical.  Health Maintenance, Female Adopting a healthy lifestyle and getting preventive care can go a long way to promote health and wellness. Talk with your health care provider about what schedule of regular examinations is right for you. This is a good chance for you to check in with your provider about disease prevention and staying healthy. In between checkups, there are plenty of things you can do on your own. Experts have done a lot of research about which lifestyle changes and preventive measures are most likely to keep you healthy. Ask your health care provider for more information. WEIGHT AND DIET  Eat a healthy diet  Be sure to include plenty of vegetables, fruits, low-fat dairy products, and lean protein.  Do not eat a lot of foods high in solid fats, added sugars, or salt.  Get regular exercise. This is one of the most important things you can do for your health.  Most adults should exercise for at least 150 minutes each week. The exercise should increase your heart rate and make you sweat (moderate-intensity exercise).  Most adults should also do strengthening exercises at least twice a week. This is in addition to the moderate-intensity exercise.  Maintain a healthy weight  Body mass index (BMI) is a measurement that can be used to identify possible weight problems. It estimates body fat based on height and weight. Your health care provider can help determine your BMI and help you achieve or maintain a healthy weight.  For females 47 years of age and older:   A BMI below 18.5 is considered underweight.  A BMI of 18.5 to 24.9 is normal.  A BMI of 25 to 29.9 is considered overweight.  A BMI of 30 and above is considered obese.  Watch levels  of cholesterol and blood lipids  You should start having your blood tested for lipids and cholesterol at 52 years of age, then have this test every 5 years.  You may need to have your cholesterol levels checked more often if:  Your lipid or cholesterol levels are high.  You are older than 51 years of age.  You are at high risk for heart disease.  CANCER SCREENING   Lung Cancer  Lung cancer screening is recommended for adults 34-25 years old who are at high risk for lung cancer because of a history of smoking.  A yearly low-dose CT scan of the lungs is recommended for people who:  Currently smoke.  Have quit within the past 15 years.  Have at least a 30-pack-year history of smoking. A pack year is smoking an average of one pack of cigarettes a day for 1 year.  Yearly screening should continue until it has been 15 years since you quit.  Yearly screening should stop if you develop a health problem that would prevent you from having lung cancer treatment.  Breast Cancer  Practice breast self-awareness. This means understanding how your breasts normally appear and feel.  It also means doing regular breast self-exams. Let your health care provider know about any changes, no matter how small.  If you are in your 20s or 30s, you should have a clinical breast exam (CBE) by a health care provider every 1-3 years as part of a regular health  exam.  If you are 40 or older, have a CBE every year. Also consider having a breast X-ray (mammogram) every year.  If you have a family history of breast cancer, talk to your health care provider about genetic screening.  If you are at high risk for breast cancer, talk to your health care provider about having an MRI and a mammogram every year.  Breast cancer gene (BRCA) assessment is recommended for women who have family members with BRCA-related cancers. BRCA-related cancers include:  Breast.  Ovarian.  Tubal.  Peritoneal  cancers.  Results of the assessment will determine the need for genetic counseling and BRCA1 and BRCA2 testing. Cervical Cancer Your health care provider may recommend that you be screened regularly for cancer of the pelvic organs (ovaries, uterus, and vagina). This screening involves a pelvic examination, including checking for microscopic changes to the surface of your cervix (Pap test). You may be encouraged to have this screening done every 3 years, beginning at age 41.  For women ages 38-65, health care providers may recommend pelvic exams and Pap testing every 3 years, or they may recommend the Pap and pelvic exam, combined with testing for human papilloma virus (HPV), every 5 years. Some types of HPV increase your risk of cervical cancer. Testing for HPV may also be done on women of any age with unclear Pap test results.  Other health care providers may not recommend any screening for nonpregnant women who are considered low risk for pelvic cancer and who do not have symptoms. Ask your health care provider if a screening pelvic exam is right for you.  If you have had past treatment for cervical cancer or a condition that could lead to cancer, you need Pap tests and screening for cancer for at least 20 years after your treatment. If Pap tests have been discontinued, your risk factors (such as having a new sexual partner) need to be reassessed to determine if screening should resume. Some women have medical problems that increase the chance of getting cervical cancer. In these cases, your health care provider may recommend more frequent screening and Pap tests. Colorectal Cancer  This type of cancer can be detected and often prevented.  Routine colorectal cancer screening usually begins at 52 years of age and continues through 52 years of age.  Your health care provider may recommend screening at an earlier age if you have risk factors for colon cancer.  Your health care provider may also  recommend using home test kits to check for hidden blood in the stool.  A small camera at the end of a tube can be used to examine your colon directly (sigmoidoscopy or colonoscopy). This is done to check for the earliest forms of colorectal cancer.  Routine screening usually begins at age 10.  Direct examination of the colon should be repeated every 5-10 years through 52 years of age. However, you may need to be screened more often if early forms of precancerous polyps or small growths are found. Skin Cancer  Check your skin from head to toe regularly.  Tell your health care provider about any new moles or changes in moles, especially if there is a change in a mole's shape or color.  Also tell your health care provider if you have a mole that is larger than the size of a pencil eraser.  Always use sunscreen. Apply sunscreen liberally and repeatedly throughout the day.  Protect yourself by wearing long sleeves, pants, a wide-brimmed hat, and sunglasses  whenever you are outside. HEART DISEASE, DIABETES, AND HIGH BLOOD PRESSURE   High blood pressure causes heart disease and increases the risk of stroke. High blood pressure is more likely to develop in:  People who have blood pressure in the high end of the normal range (130-139/85-89 mm Hg).  People who are overweight or obese.  People who are African American.  If you are 47-41 years of age, have your blood pressure checked every 3-5 years. If you are 33 years of age or older, have your blood pressure checked every year. You should have your blood pressure measured twice--once when you are at a hospital or clinic, and once when you are not at a hospital or clinic. Record the average of the two measurements. To check your blood pressure when you are not at a hospital or clinic, you can use:  An automated blood pressure machine at a pharmacy.  A home blood pressure monitor.  If you are between 17 years and 73 years old, ask your health  care provider if you should take aspirin to prevent strokes.  Have regular diabetes screenings. This involves taking a blood sample to check your fasting blood sugar level.  If you are at a normal weight and have a low risk for diabetes, have this test once every three years after 52 years of age.  If you are overweight and have a high risk for diabetes, consider being tested at a younger age or more often. PREVENTING INFECTION  Hepatitis B  If you have a higher risk for hepatitis B, you should be screened for this virus. You are considered at high risk for hepatitis B if:  You were born in a country where hepatitis B is common. Ask your health care provider which countries are considered high risk.  Your parents were born in a high-risk country, and you have not been immunized against hepatitis B (hepatitis B vaccine).  You have HIV or AIDS.  You use needles to inject street drugs.  You live with someone who has hepatitis B.  You have had sex with someone who has hepatitis B.  You get hemodialysis treatment.  You take certain medicines for conditions, including cancer, organ transplantation, and autoimmune conditions. Hepatitis C  Blood testing is recommended for:  Everyone born from [birth year] through 12/22/1965.  Anyone with known risk factors for hepatitis C. Sexually transmitted infections (STIs)  You should be screened for sexually transmitted infections (STIs) including gonorrhea and chlamydia if:  You are sexually active and are younger than 52 years of age.  You are older than 52 years of age and your health care provider tells you that you are at risk for this type of infection.  Your sexual activity has changed since you were last screened and you are at an increased risk for chlamydia or gonorrhea. Ask your health care provider if you are at risk.  If you do not have HIV, but are at risk, it may be recommended that you take a prescription medicine daily to prevent HIV  infection. This is called pre-exposure prophylaxis (PrEP). You are considered at risk if:  You are sexually active and do not regularly use condoms or know the HIV status of your partner(s).  You take drugs by injection.  You are sexually active with a partner who has HIV. Talk with your health care provider about whether you are at high risk of being infected with HIV. If you choose to begin PrEP, you should first be  tested for HIV. You should then be tested every 3 months for as long as you are taking PrEP.  PREGNANCY   If you are premenopausal and you may become pregnant, ask your health care provider about preconception counseling.  If you may become pregnant, take 400 to 800 micrograms (mcg) of folic acid every day.  If you want to prevent pregnancy, talk to your health care provider about birth control (contraception). OSTEOPOROSIS AND MENOPAUSE   Osteoporosis is a disease in which the bones lose minerals and strength with aging. This can result in serious bone fractures. Your risk for osteoporosis can be identified using a bone density scan.  If you are 25 years of age or older, or if you are at risk for osteoporosis and fractures, ask your health care provider if you should be screened.  Ask your health care provider whether you should take a calcium or vitamin D supplement to lower your risk for osteoporosis.  Menopause may have certain physical symptoms and risks.  Hormone replacement therapy may reduce some of these symptoms and risks. Talk to your health care provider about whether hormone replacement therapy is right for you.  HOME CARE INSTRUCTIONS   Schedule regular health, dental, and eye exams.  Stay current with your immunizations.   Do not use any tobacco products including cigarettes, chewing tobacco, or electronic cigarettes.  If you are pregnant, do not drink alcohol.  If you are breastfeeding, limit how much and how often you drink alcohol.  Limit  alcohol intake to no more than 1 drink per day for nonpregnant women. One drink equals 12 ounces of beer, 5 ounces of wine, or 1 ounces of hard liquor.  Do not use street drugs.  Do not share needles.  Ask your health care provider for help if you need support or information about quitting drugs.  Tell your health care provider if you often feel depressed.  Tell your health care provider if you have ever been abused or do not feel safe at home.   This information is not intended to replace advice given to you by your health care provider. Make sure you discuss any questions you have with your health care provider.   Document Released: 10/12/2010 Document Revised: 04/19/2014 Document Reviewed: 02/28/2013 Elsevier Interactive Patient Education [employer].

## 2015-05-13 LAB — CBC WITH DIFFERENTIAL/PLATELET
Basophils Absolute: 0 10*3/uL (ref 0.0–0.1)
Basophils Relative: 0.3 % (ref 0.0–3.0)
EOS PCT: 3.8 % (ref 0.0–5.0)
Eosinophils Absolute: 0.2 10*3/uL (ref 0.0–0.7)
HEMATOCRIT: 37.5 % (ref 36.0–46.0)
HEMOGLOBIN: 12.3 g/dL (ref 12.0–15.0)
LYMPHS PCT: 34.5 % (ref 12.0–46.0)
Lymphs Abs: 2 10*3/uL (ref 0.7–4.0)
MCHC: 32.8 g/dL (ref 30.0–36.0)
MCV: 90.2 fl (ref 78.0–100.0)
MONOS PCT: 8.8 % (ref 3.0–12.0)
Monocytes Absolute: 0.5 10*3/uL (ref 0.1–1.0)
Neutro Abs: 3.1 10*3/uL (ref 1.4–7.7)
Neutrophils Relative %: 52.6 % (ref 43.0–77.0)
Platelets: 285 10*3/uL (ref 150.0–400.0)
RBC: 4.16 Mil/uL (ref 3.87–5.11)
RDW: 12.5 % (ref 11.5–15.5)
WBC: 5.8 10*3/uL (ref 4.0–10.5)

## 2015-05-13 LAB — BASIC METABOLIC PANEL
BUN: 11 mg/dL (ref 6–23)
CO2: 27 mEq/L (ref 19–32)
Calcium: 9.3 mg/dL (ref 8.4–10.5)
Chloride: 103 mEq/L (ref 96–112)
Creatinine, Ser: 0.7 mg/dL (ref 0.40–1.20)
GFR: 113.03 mL/min (ref >60.00)
GLUCOSE: 79 mg/dL (ref 70–99)
POTASSIUM: 4.2 meq/L (ref 3.5–5.1)
Sodium: 137 mEq/L (ref 135–145)

## 2015-05-13 LAB — LIPID PANEL
CHOL/HDL RATIO: 3
Cholesterol: 173 mg/dL (ref 0–200)
HDL: 54.2 mg/dL (ref >39.00)
LDL Cholesterol: 93 mg/dL (ref 0–99)
NONHDL: 118.51
TRIGLYCERIDES: 129 mg/dL (ref 0.0–149.0)
VLDL: 25.8 mg/dL (ref 0.0–40.0)

## 2015-05-13 LAB — TSH: TSH: 1.33 u[IU]/mL (ref 0.35–4.50)

## 2015-05-19 ENCOUNTER — Encounter: Payer: Self-pay | Admitting: *Deleted

## 2015-07-17 ENCOUNTER — Other Ambulatory Visit: Payer: BLUE CROSS/BLUE SHIELD

## 2015-07-17 DIAGNOSIS — Z1211 Encounter for screening for malignant neoplasm of colon: Secondary | ICD-10-CM

## 2015-07-17 LAB — FECAL OCCULT BLOOD, GUAIAC: Fecal Occult Blood: NEGATIVE

## 2015-07-18 ENCOUNTER — Encounter: Payer: Self-pay | Admitting: *Deleted

## 2015-07-18 LAB — FECAL OCCULT BLOOD, IMMUNOCHEMICAL: FECAL OCCULT BLD: NEGATIVE

## 2015-11-18 ENCOUNTER — Ambulatory Visit (INDEPENDENT_AMBULATORY_CARE_PROVIDER_SITE_OTHER): Payer: BLUE CROSS/BLUE SHIELD | Admitting: Family Medicine

## 2015-11-18 ENCOUNTER — Ambulatory Visit (INDEPENDENT_AMBULATORY_CARE_PROVIDER_SITE_OTHER)
Admission: RE | Admit: 2015-11-18 | Discharge: 2015-11-18 | Disposition: A | Discharge status: Home / Self Care | Payer: BLUE CROSS/BLUE SHIELD | Source: Ambulatory Visit | Attending: Family Medicine | Admitting: Family Medicine

## 2015-11-18 ENCOUNTER — Encounter: Payer: Self-pay | Admitting: Family Medicine

## 2015-11-18 VITALS — BP 118/80 | HR 67 | Temp 99.1°F | Wt 243.5 lb

## 2015-11-18 DIAGNOSIS — M25561 Pain in right knee: Secondary | ICD-10-CM

## 2015-11-18 DIAGNOSIS — M179 Osteoarthritis of knee, unspecified: Secondary | ICD-10-CM | POA: Diagnosis not present

## 2015-11-18 NOTE — Progress Notes (Signed)
Pre visit review using our clinic review tool, if applicable. No additional management support is needed unless otherwise documented below in the visit note. 

## 2015-11-18 NOTE — Progress Notes (Signed)
R knee.  Was playing basketball.  "Tweaked" her R knee.   Medial/anterior knee pain.  Incident was about 1 month ago.  She didn't feel a specific injury at the time, but noted discomfort a few days later.  No acute L knee troubles.   R knee with some swelling but no bruising or redness.    Taking ibuprofen and elevating her knee some, some relief until recently.  Feels better straight.  Pain with flexion at >90 deg.  No locking.   Currently with menses.  Not pregnant.    Meds, vitals, and allergies reviewed.   ROS: Per HPI unless specifically indicated in ROS section   nad R knee slightly puffy but not bruised or red.  Sig crepitus on ROM.  Pain with flexion >90 deg but no locking.  ACL MCL LCL feel solid.   ttp medial and inferior to the patella, patella itself isn't ttp Able to bear weight.

## 2015-11-18 NOTE — Patient Instructions (Signed)
Ice, ibuprofen 600mg  3 times a day with food.  Xray on the way out.  We'll be in touch.  Take care.  Glad to see you.

## 2015-11-19 DIAGNOSIS — M25561 Pain in right knee: Secondary | ICD-10-CM | POA: Insufficient documentation

## 2015-11-19 NOTE — Assessment & Plan Note (Signed)
Likely OA flare, ice, ibuprofen 600mg  3 times a day with food.  See imaging.

## 2015-12-17 ENCOUNTER — Ambulatory Visit (INDEPENDENT_AMBULATORY_CARE_PROVIDER_SITE_OTHER): Payer: BLUE CROSS/BLUE SHIELD | Admitting: Family Medicine

## 2015-12-17 ENCOUNTER — Ambulatory Visit: Payer: BLUE CROSS/BLUE SHIELD | Admitting: Family Medicine

## 2015-12-17 ENCOUNTER — Encounter: Payer: Self-pay | Admitting: Family Medicine

## 2015-12-17 VITALS — BP 112/72 | HR 81 | Temp 98.6°F | Ht 70.0 in | Wt 242.2 lb

## 2015-12-17 DIAGNOSIS — M25561 Pain in right knee: Secondary | ICD-10-CM | POA: Diagnosis not present

## 2015-12-17 DIAGNOSIS — M1711 Unilateral primary osteoarthritis, right knee: Secondary | ICD-10-CM

## 2015-12-17 MED ORDER — METHYLPREDNISOLONE ACETATE 40 MG/ML IJ SUSP
80.0000 mg | Freq: Once | INTRAMUSCULAR | Status: AC
  Administered 2015-12-17: 80 mg via INTRA_ARTICULAR

## 2015-12-17 NOTE — Progress Notes (Signed)
Pre visit review using our clinic review tool, if applicable. No additional management support is needed unless otherwise documented below in the visit note. 

## 2015-12-17 NOTE — Progress Notes (Signed)
Dr. Frederico Hamman T. Graci Hulce, MD, [facility] [place], [address] Phone: [phone] Fax: [phone]  12/17/2015  Patient: Breanna Weber, MRN: [id number], DOB: 11-04-1965, 50 y.o.  Primary Physician:  Ria Bush, MD   Chief Complaint  Patient presents with  . Knee Pain    Right   Subjective:   Alonzo Mclennan is a 50 y.o. very pleasant female patient who presents with the following:  R knee pain  Twisted her knee about 8 weeks ago, then saw Dr. Damita Dunnings. He gave her ibuprofen 600 mg p.o. T.i.d. Iinitially, she had a twisting injury while playing basketball with her son.  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  She does have some baseline knee pain, but she is able to play basketball  And shooting drill with her son.  After the  Time in question, her baseline dramatically changed, now she is not able to do even basic movements.  Past Medical History, Surgical History, Social History, Family History, Problem List, Medications, and Allergies have been reviewed and updated if relevant.  Patient Active Problem List   Diagnosis Date Noted  . Knee pain, right 11/19/2015  . Obesity, Class I, BMI 30-34.9 05/12/2015  . HLD (hyperlipidemia) 05/12/2015  . Left shoulder pain 05/12/2015  . Decreased visual acuity 05/12/2015  . DUB (dysfunctional uterine bleeding) 12/21/2011  . Fibroids   . Healthcare maintenance 2011-07-18    Past Medical History:  Diagnosis Date  . Fibroids   . History of chicken pox   . Uterine fibroid    removed    Past Surgical History:  Procedure Laterality Date  . CESAREAN SECTION  2002/07/18  . GANGLION CYST EXCISION  x2  . MYOMECTOMY  Jul 18, 1999  . [place]  July 18, 1999  . UTERINE FIBROID SURGERY  2000/07/17    Social History   Social History  . Marital status: Widowed    Spouse name: N/A  . Number of children: N/A  . Years of education: N/A   Occupational  History  . Not on file.   Social History Main Topics  . Smoking status: Never Smoker  . Smokeless tobacco: Never Used  . Alcohol use No     Comment: Occasional  . Drug use: No  . Sexual activity: Yes    Partners: Male    Birth control/ protection: Pill, None   Other Topics Concern  . Not on file   Social History Narrative   Caffeine: none   Lives with son   Husband died 07/17/09 in [place], moved to be closer to family.   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 Hypertension Mother   . Asthma Mother   . Hypertension Maternal Grandmother   . Cancer Maternal Grandmother     ovarian cancer  . Stroke Maternal Grandmother   . Coronary artery disease Maternal Grandmother 07/18/63  . Diabetes Maternal Grandfather   . Hypertension Maternal Grandfather   . Thyroid disease Sister   . Thyroid disease Maternal Aunt   . Thyroid disease Maternal Grandmother     Allergies  Allergen Reactions  . Aspirin Other (See Comments)    nosebleed  . Ciprofloxacin Swelling    Lip swelling  Medication list reviewed and updated in full in [place].  GEN: No fevers, chills. Nontoxic. Primarily MSK c/o today. MSK: Detailed in the HPI GI: tolerating PO intake without difficulty Neuro: No numbness, parasthesias, or tingling associated. Otherwise the pertinent positives of the ROS are noted above.   Objective:   BP 112/72   Pulse 81   Temp 98.6 F (37 C) (Oral)   Ht 5\' 10"  (1.778 m)   Wt 242 lb 4 oz (109.9 kg)   LMP 11/14/2015   BMI 34.76 kg/m    GEN: WDWN, NAD, Non-toxic, Alert & Oriented x 3 HEENT: Atraumatic, Normocephalic.  Ears and Nose: No external deformity. EXTR: No clubbing/cyanosis/edema NEURO: Normal gait. antalgia PSYCH:  Normally interactive. Conversant. Not depressed or anxious appearing.  Calm demeanor.   Knee:  R Gait: Normal heel toe pattern, antalgia ROM: lloss of 2 of extension on the right.  Flexion to 90.  On the left, the patient has 0-100. Effusion: neg Echymosis or edema: none Patellar tendon NT Painful PLICA: neg Patellar grind: negative Medial and lateral patellar facet loading: negative medial and lateral joint lines: marked medial joint line tenderness Mcmurray's pos for pain Flexion-pinch pos Bounce home + Varus and valgus stress: stable Lachman: neg Ant and Post drawer: neg Hip abduction, IR, ER: WNL Hip flexion str: 5/5 Hip abd: 5/5 Quad: 5/5 VMO atrophy:No Hamstring concentric and eccentric: 5/5   Radiology: Dg Knee 3 Views Right  Result Date: 11/18/2015 CLINICAL DATA:  Knee pain. EXAM: RIGHT KNEE - 3 VIEW COMPARISON:  No prior. FINDINGS: Severe tricompartment degenerative change. No evidence of fracture or dislocation. No acute bony abnormality . IMPRESSION: Severe tricompartment degenerative change.  No acute abnormality. Electronically Signed   By: Marcello Moores  Register   On: 11/18/2015 14:44    Assessment and Plan:   Right knee pain - Plan: methylPREDNISolone acetate (DEPO-MEDROL) injection 80 mg  Primary osteoarthritis of right knee - Plan: methylPREDNISolone acetate (DEPO-MEDROL) injection 80 mg  Radiographs show advanced osteoarthritis her age.  Knee flexion is currently only 90.  Notable joint line tenderness, new after acute injury.  She may have an acute internal derangement or meniscal tear additionally.  Reviewed conservative versus more aggressive options in advanced imaging, and for now the patient would like to do a conservative trial with recheck.  Knee Injection, RIGHT Patient verbally consented to procedure. Risks (including potential rare risk of infection), benefits, and alternatives explained. Sterilely prepped with Chloraprep. Ethyl cholride used for  anesthesia. 8 cc Lidocaine 1% mixed with 2 mL Depo-Medrol 40 mg injected using the anteromedial approach without difficulty. No complications with procedure and tolerated well. Patient had decreased pain post-injection.   Follow-up: Return in about 4 weeks (around 01/14/2016).  Signed,  Maud Deed. Jaramie Bastos, MD   Patient's Medications  New Prescriptions   No medications on file  Previous Medications   ACETAMINOPHEN (TYLENOL) 650 MG CR TABLET    Take 650 mg by mouth every 8 (eight) hours as needed for pain.   IBUPROFEN (ADVIL,MOTRIN) 200 MG TABLET    Take 600 mg by mouth every 6 (six) hours as needed.  Modified Medications   No medications on file  Discontinued Medications   No medications on file

## 2016-01-14 ENCOUNTER — Ambulatory Visit: Payer: BLUE CROSS/BLUE SHIELD | Admitting: Family Medicine

## 2016-01-14 ENCOUNTER — Telehealth: Payer: Self-pay | Admitting: Family Medicine

## 2016-01-14 DIAGNOSIS — Z0289 Encounter for other administrative examinations: Secondary | ICD-10-CM

## 2016-01-14 NOTE — Telephone Encounter (Signed)
Patient did not come in for their appointment today for 4 week follow up  Please let me know if patient needs to be contacted immediately for follow up or no follow up needed.

## 2016-01-14 NOTE — Telephone Encounter (Signed)
F/u prn

## 2016-03-29 ENCOUNTER — Encounter: Payer: Self-pay | Admitting: Obstetrics & Gynecology

## 2016-03-29 ENCOUNTER — Ambulatory Visit (INDEPENDENT_AMBULATORY_CARE_PROVIDER_SITE_OTHER): Payer: BLUE CROSS/BLUE SHIELD | Admitting: Obstetrics & Gynecology

## 2016-03-29 VITALS — BP 135/91 | HR 84 | Resp 18 | Ht 70.0 in | Wt 230.0 lb

## 2016-03-29 DIAGNOSIS — Z1231 Encounter for screening mammogram for malignant neoplasm of breast: Secondary | ICD-10-CM

## 2016-03-29 DIAGNOSIS — Z124 Encounter for screening for malignant neoplasm of cervix: Secondary | ICD-10-CM

## 2016-03-29 DIAGNOSIS — Z0000 Encounter for general adult medical examination without abnormal findings: Secondary | ICD-10-CM

## 2016-03-29 DIAGNOSIS — Z01419 Encounter for gynecological examination (general) (routine) without abnormal findings: Secondary | ICD-10-CM | POA: Diagnosis not present

## 2016-03-29 DIAGNOSIS — Z1151 Encounter for screening for human papillomavirus (HPV): Secondary | ICD-10-CM

## 2016-03-29 DIAGNOSIS — Z113 Encounter for screening for infections with a predominantly sexual mode of transmission: Secondary | ICD-10-CM | POA: Diagnosis not present

## 2016-03-29 LAB — HEPATITIS C ANTIBODY: HCV Ab: NEGATIVE

## 2016-03-29 LAB — HEPATITIS B SURFACE ANTIGEN: Hepatitis B Surface Ag: NEGATIVE

## 2016-03-29 NOTE — Patient Instructions (Signed)
Thank you for enrolling in [facility]. Please follow the instructions below to securely access your online medical record. MyChart allows you to send messages to your doctor, view your test results, manage appointments, and more.   How Do I Sign Up? 1. In your Internet browser, go to [employer] and enter [email or web address]. 2. Click on the Sign Up Now link in the Sign In box. You will see the New Member Sign Up page. 3. Enter your MyChart Access Code exactly as it appears below. You will not need to use this code after you've completed the sign-up process. If you do not sign up before the expiration date, you must request a new code.  MyChart Access Code: [email or web address] Expires: 05/28/2016  2:24 PM  4. Enter your Social Security Number ([email or web address]) and Date of Birth (mm/dd/yyyy) as indicated and click Submit. You will be taken to the next sign-up page. 5. Create a MyChart ID. This will be your MyChart login ID and cannot be changed, so think of one that is secure and easy to remember. 6. Create a MyChart password. You can change your password at any time. 7. Enter your Password Reset Question and Answer. This can be used at a later time if you forget your password.  8. Enter your e-mail address. You will receive e-mail notification when new information is available in [place]. 9. Click Sign Up. You can now view your medical record.   Additional Information Remember, MyChart is NOT to be used for urgent needs. For medical emergencies, dial 911.   Preventive Care 40-64 Years, Female Preventive care refers to lifestyle choices and visits with your health care provider that can promote health and wellness. What does preventive care include?  A yearly physical exam. This is also called an annual well check.  Dental exams once or twice a year.  Routine eye exams. Ask your health care provider how often you should have your eyes checked.  Personal lifestyle choices,  including:  Daily care of your teeth and gums.  Regular physical activity.  Eating a healthy diet.  Avoiding tobacco and drug use.  Limiting alcohol use.  Practicing safe sex.  Taking low-dose aspirin daily starting at age 62.  Taking vitamin and mineral supplements as recommended by your health care provider. What happens during an annual well check? The services and screenings done by your health care provider during your annual well check will depend on your age, overall health, lifestyle risk factors, and family history of disease. Counseling  Your health care provider may ask you questions about your:  Alcohol use.  Tobacco use.  Drug use.  Emotional well-being.  Home and relationship well-being.  Sexual activity.  Eating habits.  Work and work Statistician.  Method of birth control.  Menstrual cycle.  Pregnancy history. Screening  You may have the following tests or measurements:  Height, weight, and BMI.  Blood pressure.  Lipid and cholesterol levels. These may be checked every 5 years, or more frequently if you are over 23 years old.  Skin check.  Lung cancer screening. You may have this screening every year starting at age 52 if you have a 30-pack-year history of smoking and currently smoke or have quit within the past 15 years.  Fecal occult blood test (FOBT) of the stool. You may have this test every year starting at age 11.  Flexible sigmoidoscopy or colonoscopy. You may have a sigmoidoscopy every 5 years or a colonoscopy every 10 years starting at age 67.  Hepatitis C blood test.  Hepatitis B blood test.  Sexually transmitted disease (STD) testing.  Diabetes screening. This is done by checking your blood sugar (glucose) after you have not eaten for a while (fasting). You may have this done every 1-3 years.  Mammogram. This may be done every 1-2 years. Talk to your health care provider about when you should start having regular  mammograms. This may depend on whether you have a family history of breast cancer.  BRCA-related cancer screening. This may be done if you have a family history of breast, ovarian, tubal, or peritoneal cancers.  Pelvic exam and Pap test. This may be done every 3 years starting at age 63. Starting at age 58, this may be done every 5 years if you have a Pap test in combination with an HPV test.  Bone density scan. This is done to screen for osteoporosis. You may have this scan if you are at high risk for osteoporosis. Discuss your test results, treatment options, and if necessary, the need for more tests with your health care provider. Vaccines  Your health care provider may recommend certain vaccines, such as:  Influenza vaccine. This is recommended every year.  Tetanus, diphtheria, and acellular pertussis (Tdap, Td) vaccine. You may need a Td booster every 10 years.  Varicella vaccine. You may need this if you have not been vaccinated.  Zoster vaccine. You may need this after age 47.  Measles, mumps, and rubella (MMR) vaccine. You may need at least one dose of MMR if you were born in 1957 or later. You may also need a second dose.  Pneumococcal 13-valent conjugate (PCV13) vaccine. You may need this if you have certain conditions and were not previously vaccinated.  Pneumococcal polysaccharide (PPSV23) vaccine. You may need one or two doses if you smoke cigarettes or if you have certain conditions.  Meningococcal vaccine. You may need this if you have certain conditions.  Hepatitis A vaccine. You may need this if you have certain conditions or if you travel or work in places where you may be exposed to hepatitis A.  Hepatitis B vaccine. You may need this if you have certain conditions or if you travel or work in places where you may be exposed to hepatitis B.  Haemophilus influenzae type b (Hib) vaccine. You may need this if you have certain conditions. Talk to your health care  provider about which screenings and vaccines you need and how often you need them. This information is not intended to replace advice given to you by your health care provider. Make sure you discuss any questions you have with your health care provider. Document Released: 04/25/2015 Document Revised: 12/17/2015 Document Reviewed: 01/28/2015 Elsevier Interactive Patient Education  2017 [employer].

## 2016-03-29 NOTE — Progress Notes (Signed)
GYNECOLOGY ANNUAL PREVENTATIVE CARE ENCOUNTER NOTE  Subjective:   Breanna Weber is a 50 y.o. G70P0021 female here for a routine annual gynecologic exam.  Current complaints: none.  Desires STI screen.   Denies abnormal vaginal bleeding, discharge, pelvic pain, problems with intercourse or other gynecologic concerns.    Gynecologic History Patient's last menstrual period was 07/29/2015. Contraception: none Last Pap: 07-27-2013. Results were: normal Last mammogram: Jul 27, 2013. Results were: normal  Obstetric History OB History  Gravida Para Term Preterm AB Living  3 1     2 1   SAB TAB Ectopic Multiple Live Births  2            # Outcome Date GA Lbr Len/2nd Weight Sex Delivery Anes PTL Lv  3 Para Jul 28, 2002    M CS-LTranv     2 SAB           1 SAB               Past Medical History:  Diagnosis Date  . Fibroids   . History of chicken pox   . Uterine fibroid    removed    Past Surgical History:  Procedure Laterality Date  . CESAREAN SECTION  07/28/02  . GANGLION CYST EXCISION  x2  . MYOMECTOMY  28-Jul-1999  . [place]  1999-07-28  . UTERINE FIBROID SURGERY  27-Jul-2000    Current Outpatient Prescriptions on File Prior to Visit  Medication Sig Dispense Refill  . acetaminophen (TYLENOL) 650 MG CR tablet Take 650 mg by mouth every 8 (eight) hours as needed for pain.    Marland Kitchen ibuprofen (ADVIL,MOTRIN) 200 MG tablet Take 600 mg by mouth every 6 (six) hours as needed.     No current facility-administered medications on file prior to visit.     Allergies  Allergen Reactions  . Aspirin Other (See Comments)    nosebleed  . Ciprofloxacin Swelling    Lip swelling    Social History   Social History  . Marital status: Widowed    Spouse name: N/A  . Number of children: N/A  . Years of education: N/A   Occupational History  . Not on file.   Social History Main Topics  . Smoking status: Never Smoker  . Smokeless tobacco: Never Used  . Alcohol use No     Comment: Occasional  . Drug  use: No  . Sexual activity: Yes    Partners: Male    Birth control/ protection: None   Other Topics Concern  . Not on file   Social History Narrative   Caffeine: none   Lives with son   Husband died 2009-07-27 in [place], moved to be closer to family.   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 Hypertension Mother   . Asthma Mother   . Hypertension Maternal Grandmother   . Cancer Maternal Grandmother     ovarian cancer  . Stroke Maternal Grandmother   . Coronary artery disease Maternal Grandmother 07-28-2063  . Thyroid disease Maternal Grandmother   . Diabetes Maternal Grandfather   . Hypertension Maternal Grandfather   . Thyroid disease Sister   . Thyroid disease Maternal Aunt     The following portions of the patient's history were reviewed and updated  as appropriate: allergies, current medications, past family history, past medical history, past social history, past surgical history and problem list.  Review of Systems Pertinent items noted in HPI and remainder of comprehensive ROS otherwise negative.   Objective:  BP (!) 135/91 (BP Location: Left Arm, Patient Position: Sitting, Cuff Size: Large)   Pulse 84   Resp 18   Ht 5\' 10"  (1.778 m)   Wt 230 lb (104.3 kg)   LMP 07/29/2015   BMI 33.00 kg/m  CONSTITUTIONAL: Well-developed, well-nourished female in no acute distress.  HENT:  Normocephalic, atraumatic, External right and left ear normal. Oropharynx is clear and moist EYES: Conjunctivae and EOM are normal. Pupils are equal, round, and reactive to light. No scleral icterus.  NECK: Normal range of motion, supple, no masses.  Normal thyroid.  SKIN: Skin is warm and dry. No rash noted. Not diaphoretic. No erythema. No pallor. NEUROLOGIC: Alert  and oriented to person, place, and time. Normal reflexes, muscle tone coordination. No cranial nerve deficit noted. PSYCHIATRIC: Normal mood and affect. Normal behavior. Normal judgment and thought content. CARDIOVASCULAR: Normal heart rate noted, regular rhythm RESPIRATORY: Clear to auscultation bilaterally. Effort and breath sounds normal, no problems with respiration noted. BREASTS: Symmetric in size. No masses, skin changes, nipple drainage, or lymphadenopathy. ABDOMEN: Soft, normal bowel sounds, no distention noted.  No tenderness, rebound or guarding.  PELVIC: Normal appearing external genitalia; normal appearing vaginal mucosa and cervix.  No abnormal discharge noted.  Pap smear obtained, unable to get endocervical specimen due to stenosis.  Normal uterine size, no other palpable masses, no uterine or adnexal tenderness. MUSCULOSKELETAL: Normal range of motion. No tenderness.  No cyanosis, clubbing, or edema.  2+ distal pulses.   Assessment:  Annual gynecologic examination with pap smear Desires STI screen   Plan:  Will follow up results of pap smear and STI screen and manage accordingly. Mammogram scheduled Routine preventative health maintenance measures emphasized. Please refer to After Visit Summary for other counseling recommendations.    Verita Schneiders, MD, [place] Attending Obstetrician & Gynecologist, [facility] for [facility]

## 2016-03-30 LAB — HIV ANTIBODY (ROUTINE TESTING W REFLEX): HIV: NONREACTIVE

## 2016-03-30 LAB — CERVICOVAGINAL ANCILLARY ONLY
CHLAMYDIA, DNA PROBE: NEGATIVE
Neisseria Gonorrhea: NEGATIVE
TRICH (WINDOWPATH): NEGATIVE

## 2016-03-30 LAB — RPR

## 2016-03-31 DIAGNOSIS — Z1231 Encounter for screening mammogram for malignant neoplasm of breast: Secondary | ICD-10-CM | POA: Diagnosis not present

## 2016-03-31 DIAGNOSIS — Z9289 Personal history of other medical treatment: Secondary | ICD-10-CM | POA: Diagnosis not present

## 2016-04-01 LAB — CYTOLOGY - PAP
Diagnosis: NEGATIVE
HPV (WINDOPATH): NOT DETECTED

## 2016-09-14 ENCOUNTER — Telehealth: Payer: BLUE CROSS/BLUE SHIELD | Admitting: Family

## 2016-09-14 DIAGNOSIS — N390 Urinary tract infection, site not specified: Secondary | ICD-10-CM

## 2016-09-14 DIAGNOSIS — A499 Bacterial infection, unspecified: Secondary | ICD-10-CM

## 2016-09-14 MED ORDER — FLUCONAZOLE 150 MG PO TABS: 150.0000 mg | ORAL_TABLET | Freq: Once | ORAL | 0 refills | Status: AC

## 2016-09-14 MED ORDER — NITROFURANTOIN MONOHYD MACRO 100 MG PO CAPS: 100.0000 mg | ORAL_CAPSULE | Freq: Two times a day (BID) | ORAL | 0 refills | Status: DC

## 2016-09-14 NOTE — Progress Notes (Signed)

## 2016-12-20 IMAGING — DX DG KNEE 3 VIEWS*R*
3 series · 3 of 3 positions shown · non-contrast
Comparison: No prior.

CLINICAL DATA: Knee pain.

EXAM:
RIGHT KNEE - 3 VIEW

[knee ap]
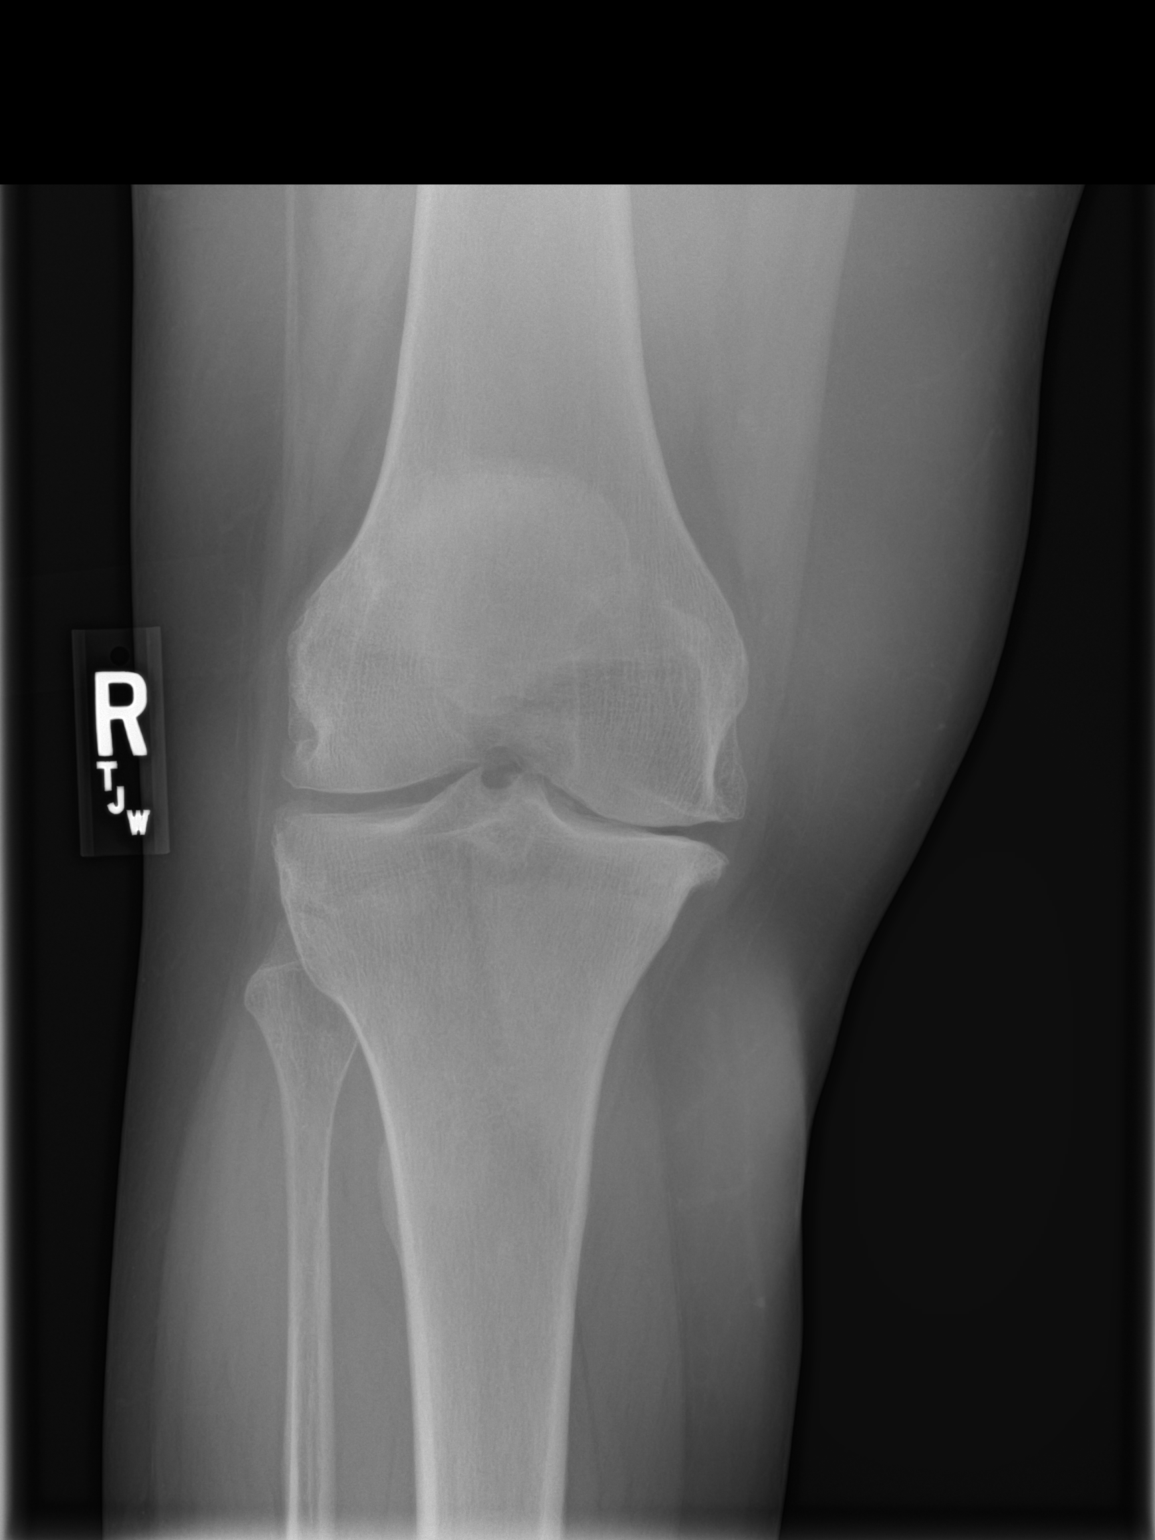

[knee lat]
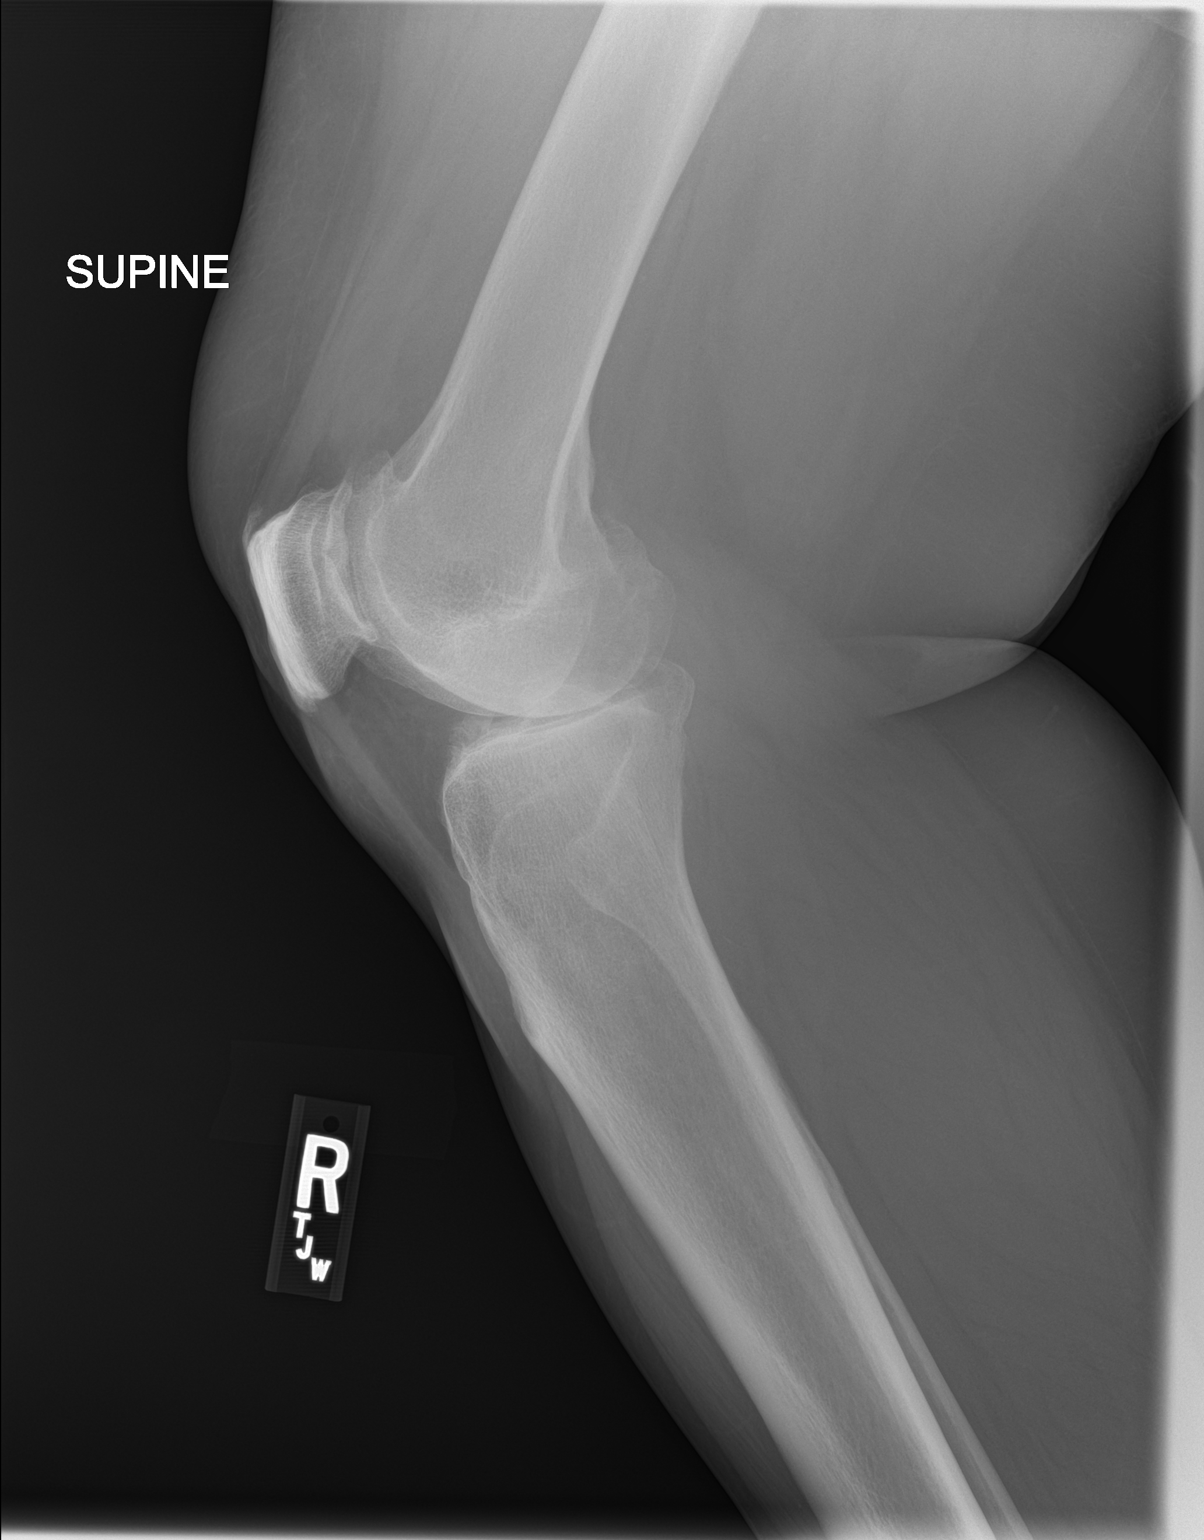

[patella skyline]
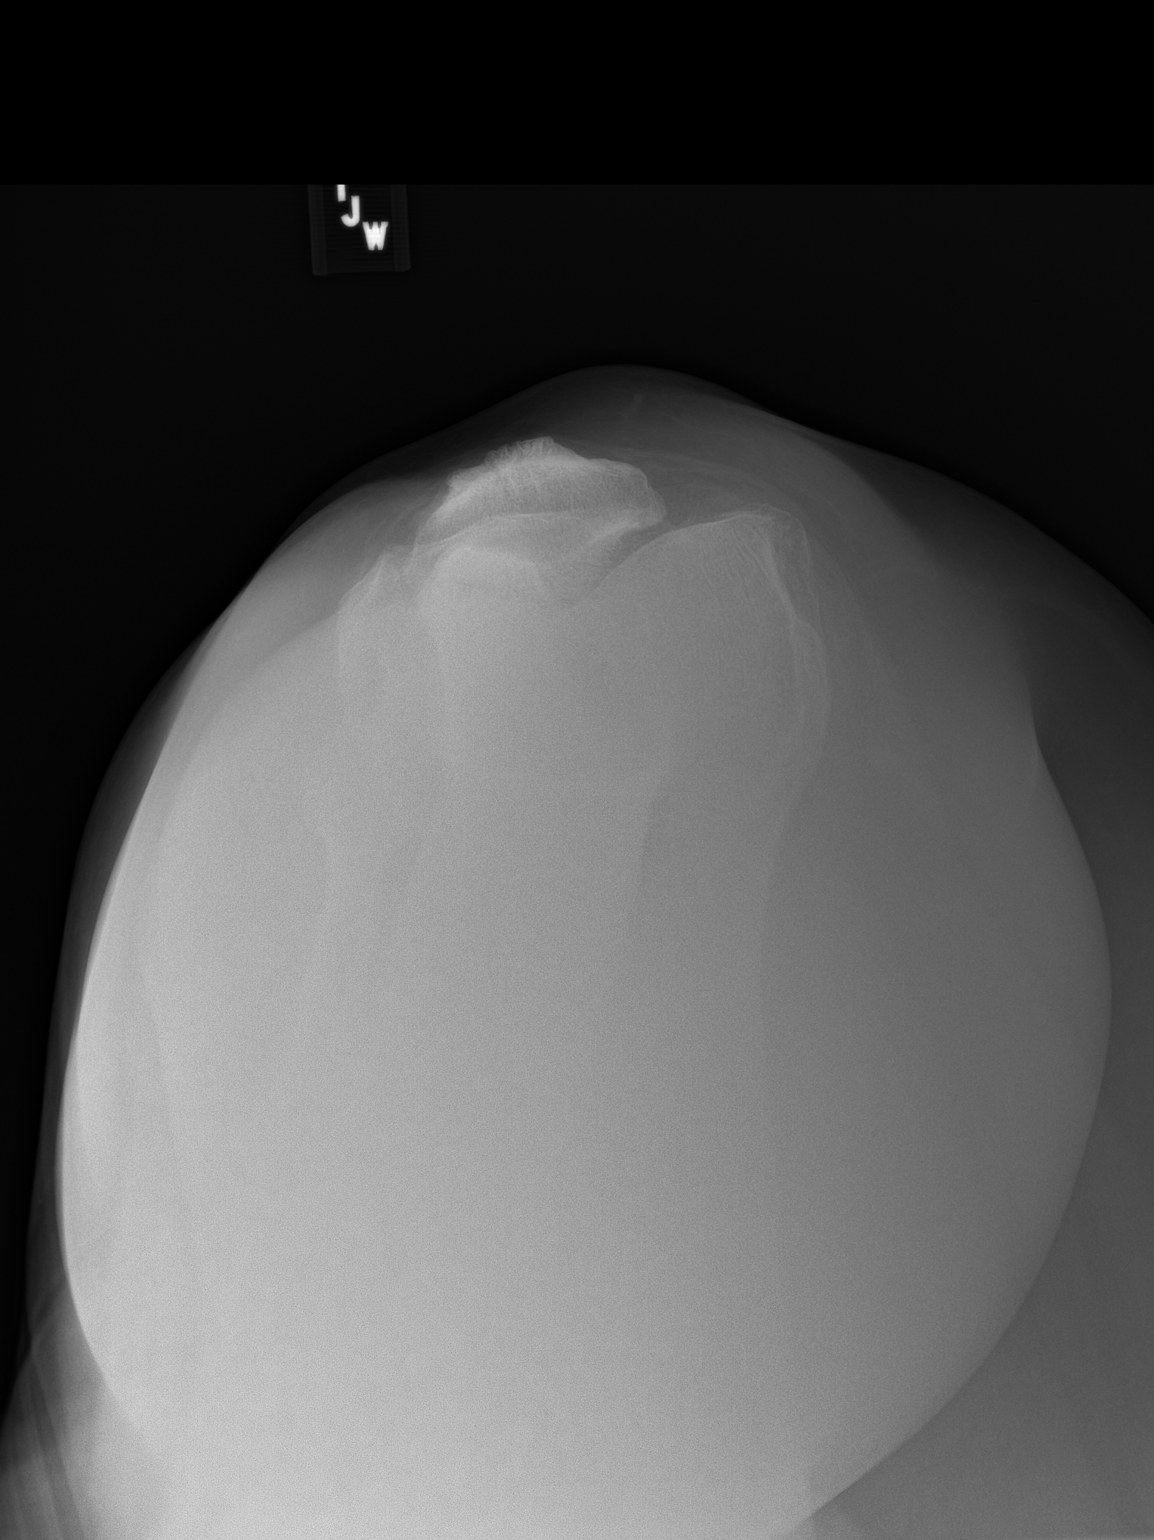

[3 of 3 positions shown; findings below may reference images not displayed]

FINDINGS: Severe tricompartment degenerative change. No evidence of fracture
or dislocation. No acute bony abnormality .
IMPRESSION: Severe tricompartment degenerative change.  No acute abnormality.

## 2017-06-02 ENCOUNTER — Emergency Department
Admission: EM | Admit: 2017-06-02 | Discharge: 2017-06-02 | Disposition: A | Discharge status: Home / Self Care | Payer: BLUE CROSS/BLUE SHIELD | Attending: Emergency Medicine | Admitting: Emergency Medicine

## 2017-06-02 DIAGNOSIS — J111 Influenza due to unidentified influenza virus with other respiratory manifestations: Secondary | ICD-10-CM | POA: Diagnosis not present

## 2017-06-02 DIAGNOSIS — R509 Fever, unspecified: Secondary | ICD-10-CM | POA: Diagnosis not present

## 2017-06-02 DIAGNOSIS — Z79899 Other long term (current) drug therapy: Secondary | ICD-10-CM | POA: Insufficient documentation

## 2017-06-02 DIAGNOSIS — J101 Influenza due to other identified influenza virus with other respiratory manifestations: Secondary | ICD-10-CM | POA: Diagnosis not present

## 2017-06-02 DIAGNOSIS — R05 Cough: Secondary | ICD-10-CM | POA: Diagnosis not present

## 2017-06-02 DIAGNOSIS — R69 Illness, unspecified: Secondary | ICD-10-CM

## 2017-06-02 LAB — INFLUENZA PANEL BY PCR (TYPE A & B)
INFLAPCR: POSITIVE — AB
Influenza B By PCR: NEGATIVE

## 2017-06-02 LAB — GROUP A STREP BY PCR: GROUP A STREP BY PCR: NOT DETECTED

## 2017-06-02 MED ORDER — OSELTAMIVIR PHOSPHATE 75 MG PO CAPS: 75.0000 mg | ORAL_CAPSULE | Freq: Two times a day (BID) | ORAL | 0 refills | Status: AC

## 2017-06-02 MED ORDER — ONDANSETRON HCL 4 MG PO TABS: 4.0000 mg | ORAL_TABLET | Freq: Three times a day (TID) | ORAL | 0 refills | Status: AC | PRN

## 2017-06-02 NOTE — ED Notes (Signed)
This RN accidentally hit acuity of 3. Acuity of 4 should have been entered.

## 2017-06-02 NOTE — ED Triage Notes (Signed)
Patient c/o generalized body aches, fever, chills, nausea X 2 days.

## 2017-06-02 NOTE — ED Provider Notes (Signed)
[facility] Emergency Department Provider Note  ____________________________________________  Time seen: Approximately 11:05 PM  I have reviewed the triage vital signs and the nursing notes.   HISTORY  Chief Complaint Fever and Generalized Body Aches    HPI Breanna Weber is a 52 y.o. female presents to the emergency department with headache, rhinorrhea, congestion, nonproductive cough, nausea, diarrhea and myalgias for the past 2 days.  Patient has sick contacts within her home.  No recent travel.  Patient denies chest pain and abdominal pain.  No alleviating measures of been attempted.   Past Medical History:  Diagnosis Date  . Fibroids   . History of chicken pox   . Uterine fibroid    removed    Patient Active Problem List   Diagnosis Date Noted  . Knee pain, right 11/19/2015  . Obesity, Class I, BMI 30-34.9 05/12/2015  . HLD (hyperlipidemia) 05/12/2015  . Left shoulder pain 05/12/2015  . Decreased visual acuity 05/12/2015  . DUB (dysfunctional uterine bleeding) 12/21/2011  . Fibroids   . Healthcare maintenance 07/12/2011    Past Surgical History:  Procedure Laterality Date  . CESAREAN SECTION  2004  . GANGLION CYST EXCISION  x2  . MYOMECTOMY  2001  . [place]  2001  . UTERINE FIBROID SURGERY  2002    Prior to Admission medications   Medication Sig Start Date End Date Taking? Authorizing Provider  acetaminophen (TYLENOL) 650 MG CR tablet Take 650 mg by mouth every 8 (eight) hours as needed for pain.    [provider]  ibuprofen (ADVIL,MOTRIN) 200 MG tablet Take 600 mg by mouth every 6 (six) hours as needed.    [provider]  nitrofurantoin, macrocrystal-monohydrate, (MACROBID) 100 MG capsule Take 1 capsule (100 mg total) by mouth 2 (two) times daily. 09/14/16   Kennyth Arnold, FNP  ondansetron (ZOFRAN) 4 MG tablet Take 1 tablet (4 mg total) by mouth every 8 (eight) hours as needed for up to 3 days  for nausea or vomiting. 06/02/17 06/05/17  Lannie Fields, PA-C  oseltamivir (TAMIFLU) 75 MG capsule Take 1 capsule (75 mg total) by mouth 2 (two) times daily for 5 days. 06/02/17 06/07/17  Lannie Fields, PA-C    Allergies Aspirin and Ciprofloxacin  Family History  Problem Relation Age of Onset  . Hypertension Mother   . Asthma Mother   . Hypertension Maternal Grandmother   . Cancer Maternal Grandmother        ovarian cancer  . Stroke Maternal Grandmother   . Coronary artery disease Maternal Grandmother 65  . Thyroid disease Maternal Grandmother   . Diabetes Maternal Grandfather   . Hypertension Maternal Grandfather   . Thyroid disease Sister   . Thyroid disease Maternal Aunt     Social History Social History   Tobacco Use  . Smoking status: Never Smoker  . Smokeless tobacco: Never Used  Substance Use Topics  . Alcohol use: No    Alcohol/week: 0.0 oz    Comment: Occasional  . Drug use: No      Review of Systems  Constitutional: Patient has fever.  Eyes: No visual changes. No discharge ENT: Patient has congestion.  Cardiovascular: no chest pain. Respiratory: Patient has cough.  Gastrointestinal: No abdominal pain.  No nausea, no vomiting. Patient had diarrhea.  Genitourinary: Negative for dysuria. No hematuria Musculoskeletal: Patient has myalgias.  Skin: Negative for rash, abrasions, lacerations, ecchymosis. Neurological: Patient has headache, no focal weakness or numbness.  ____________________________________________   PHYSICAL EXAM:  VITAL SIGNS: ED Triage Vitals [06/02/17 2222]  Enc Vitals Group     BP (!) 152/84     Pulse Rate 100     Resp 19     Temp 98.9 F (37.2 C)     Temp Source Oral     SpO2 98 %     Weight 230 lb (104.3 kg)     Height      Head Circumference      Peak Flow      Pain Score 8     Pain Loc      Pain Edu?      Excl. in [place]?      Constitutional: Alert and oriented. Patient is lying supine. Eyes: Conjunctivae are  normal. PERRL. EOMI. Head: Atraumatic. ENT:      Ears: Tympanic membranes are mildly injected with mild effusion bilaterally.       Nose: No congestion/rhinnorhea.      Mouth/Throat: Mucous membranes are moist. Posterior pharynx is mildly erythematous.  Hematological/Lymphatic/Immunilogical: No cervical lymphadenopathy.  Cardiovascular: Normal rate, regular rhythm. Normal S1 and S2.  Good peripheral circulation. Respiratory: Normal respiratory effort without tachypnea or retractions. Lungs CTAB. Good air entry to the bases with no decreased or absent breath sounds. Gastrointestinal: Bowel sounds 4 quadrants. Soft and nontender to palpation. No guarding or rigidity. No palpable masses. No distention. No CVA tenderness. Musculoskeletal: Full range of motion to all extremities. No gross deformities appreciated. Neurologic:  Normal speech and language. No gross focal neurologic deficits are appreciated.  Skin:  Skin is warm, dry and intact. No rash noted. Psychiatric: Mood and affect are normal. Speech and behavior are normal. Patient exhibits appropriate insight and judgement.    ____________________________________________   LABS (all labs ordered are listed, but only abnormal results are displayed)  Labs Reviewed  GROUP A STREP BY PCR  INFLUENZA PANEL BY PCR (TYPE A & B)   ____________________________________________  EKG   ____________________________________________  RADIOLOGY   No results found.  ____________________________________________    PROCEDURES  Procedure(s) performed:    Procedures    Medications - No data to display   ____________________________________________   INITIAL IMPRESSION / ASSESSMENT AND PLAN / ED COURSE  Pertinent labs & imaging results that were available during my care of the patient were reviewed by me and considered in my medical decision making (see chart for details).  Review of the [place] CSRS was performed in accordance of  the [place] prior to dispensing any controlled drugs.    Assessment and plan Influenza-like illness Differential diagnosis included influenza versus unspecified viral URI.  History and physical exam findings are most consistent with influenza at this time.  Patient was discharged with Tamiflu and Zofran.  Rest and hydration were encouraged.  Patient was advised to follow-up with primary care as needed.   ____________________________________________  FINAL CLINICAL IMPRESSION(S) / ED DIAGNOSES  Final diagnoses:  Influenza-like illness      NEW MEDICATIONS STARTED DURING THIS VISIT:  ED Discharge Orders        Ordered    oseltamivir (TAMIFLU) 75 MG capsule  2 times daily     06/02/17 2259    ondansetron (ZOFRAN) 4 MG tablet  Every 8 hours PRN     06/02/17 2259          This chart was dictated using voice recognition software/Dragon. Despite best efforts to proofread, errors can occur which can change the meaning. Any change was purely unintentional.  [facility] [place], Hershal Coria 06/02/17 2307    Orbie Pyo, MD 06/04/17 [phone]

## 2017-06-03 ENCOUNTER — Encounter: Payer: Self-pay | Admitting: Family Medicine

## 2017-06-03 ENCOUNTER — Ambulatory Visit (INDEPENDENT_AMBULATORY_CARE_PROVIDER_SITE_OTHER): Payer: BLUE CROSS/BLUE SHIELD | Admitting: Family Medicine

## 2017-06-03 VITALS — BP 112/66 | HR 100 | Temp 100.3°F | Ht 70.0 in | Wt 217.2 lb

## 2017-06-03 DIAGNOSIS — J101 Influenza due to other identified influenza virus with other respiratory manifestations: Secondary | ICD-10-CM

## 2017-06-03 NOTE — Progress Notes (Signed)
Subjective:    Patient ID: Breanna Weber, female    DOB: Sep 08, 1965, 52 y.o.   MRN: [id number]  HPI Here with influenza   Went to ED yesterday - with HA/nasal symptoms cough n/d and body aches   Temp: 100.3 F (37.9 C)  bp was high in ED BP Readings from Last 3 Encounters:  06/03/17 112/66  06/02/17 (!) 152/84  03/29/16 (!) 135/91   Improved now   Results for orders placed or performed during the hospital encounter of 06/02/17  Group A Strep by PCR  Result Value Ref Range   Group A Strep by PCR NOT DETECTED NOT DETECTED  Influenza panel by PCR (type A & B)  Result Value Ref Range   Influenza A By PCR POSITIVE (A) NEGATIVE   Influenza B By PCR NEGATIVE NEGATIVE    tx with tamiflu and zofran --has not filled it yet   Cough is pretty dry  Taking tylenol  Is trying to drink fluids   Taking an otc severe cold formula   Sick for 2 days (approx)  Patient Active Problem List   Diagnosis Date Noted  . Influenza A 06/03/2017  . Knee pain, right 11/19/2015  . Obesity, Class I, BMI 30-34.9 05/12/2015  . HLD (hyperlipidemia) 05/12/2015  . Left shoulder pain 05/12/2015  . Decreased visual acuity 05/12/2015  . DUB (dysfunctional uterine bleeding) 12/21/2011  . Fibroids   . Healthcare maintenance 07/12/2011   Past Medical History:  Diagnosis Date  . Fibroids   . History of chicken pox   . Uterine fibroid    removed   Past Surgical History:  Procedure Laterality Date  . CESAREAN SECTION  2004  . GANGLION CYST EXCISION  x2  . MYOMECTOMY  2001  . [place]  2001  . UTERINE FIBROID SURGERY  2002   Social History   Tobacco Use  . Smoking status: Never Smoker  . Smokeless tobacco: Never Used  Substance Use Topics  . Alcohol use: No    Alcohol/week: 0.0 oz    Comment: Occasional  . Drug use: No   Family History  Problem Relation Age of Onset  . Hypertension Mother   . Asthma Mother   . Hypertension Maternal Grandmother   . Cancer  Maternal Grandmother        ovarian cancer  . Stroke Maternal Grandmother   . Coronary artery disease Maternal Grandmother 65  . Thyroid disease Maternal Grandmother   . Diabetes Maternal Grandfather   . Hypertension Maternal Grandfather   . Thyroid disease Sister   . Thyroid disease Maternal Aunt    Allergies  Allergen Reactions  . Aspirin Other (See Comments)    nosebleed  . Ciprofloxacin Swelling    Lip swelling   Current Outpatient Medications on File Prior to Visit  Medication Sig Dispense Refill  . acetaminophen (TYLENOL) 650 MG CR tablet Take 650 mg by mouth every 8 (eight) hours as needed for pain.    Marland Kitchen ibuprofen (ADVIL,MOTRIN) 200 MG tablet Take 600 mg by mouth every 6 (six) hours as needed.    . nitrofurantoin, macrocrystal-monohydrate, (MACROBID) 100 MG capsule Take 1 capsule (100 mg total) by mouth 2 (two) times daily. 10 capsule 0  . ondansetron (ZOFRAN) 4 MG tablet Take 1 tablet (4 mg total) by mouth every 8 (eight) hours as needed for up to 3 days for nausea or vomiting. 10 tablet 0  . oseltamivir (TAMIFLU) 75 MG capsule Take 1 capsule (75 mg total) by  mouth 2 (two) times daily for 5 days. 10 capsule 0   No current facility-administered medications on file prior to visit.     Review of Systems  Constitutional: Positive for activity change, appetite change, chills, fatigue and fever.  HENT: Positive for congestion, postnasal drip, rhinorrhea, sinus pressure, sneezing and sore throat. Negative for ear pain, mouth sores and sinus pain.   Eyes: Negative for pain and discharge.  Respiratory: Positive for cough. Negative for shortness of breath, wheezing and stridor.   Cardiovascular: Negative for chest pain.  Gastrointestinal: Negative for diarrhea, nausea and vomiting.  Genitourinary: Negative for frequency, hematuria and urgency.  Musculoskeletal: Negative for arthralgias and myalgias.  Skin: Negative for rash.  Neurological: Positive for headaches. Negative for  dizziness, weakness and light-headedness.  Psychiatric/Behavioral: Negative for confusion and dysphoric mood.       Objective:   Physical Exam  Constitutional: She appears well-developed and well-nourished. No distress.  Fatigued appearing    HENT:  Head: Normocephalic and atraumatic.  Right Ear: External ear normal.  Left Ear: External ear normal.  Mouth/Throat: Oropharynx is clear and moist.  Nares are injected and congested  No sinus tenderness Clear rhinorrhea and post nasal drip   Eyes: Conjunctivae and EOM are normal. Pupils are equal, round, and reactive to light. Right eye exhibits no discharge. Left eye exhibits no discharge.  Neck: Normal range of motion. Neck supple.  Cardiovascular: Normal rate and normal heart sounds.  Pulmonary/Chest: Effort normal and breath sounds normal. No respiratory distress. She has no wheezes. She has no rales. She exhibits no tenderness.  Good air exch No rales or rhonchi  Lymphadenopathy:    She has no cervical adenopathy.  Neurological: She is alert.  Skin: Skin is warm and dry. No rash noted. No pallor.  Psychiatric: She has a normal mood and affect.          Assessment & Plan:   Problem List Items Addressed This Visit      Respiratory   Influenza A    Seen in ED last night- and had flu A screen pos Does not get flu shots (disc reconsidering this) Supportive care-flu /rest  Ibuprofen with food for fever/aches  Will fill the tamiflu now (her pharmacy just opened)  Update if not starting to improve in a week or if worsening   Reassuring exam

## 2017-06-03 NOTE — Patient Instructions (Addendum)
Tylenol and advil for fever and aches (you can alternate)  Ibuprofen -take food with   Drink lots of fluids  Get your tamiflu as directed   Update if not starting to improve in a week or if worsening

## 2017-06-03 NOTE — Assessment & Plan Note (Signed)
Seen in ED last night- and had flu A screen pos Does not get flu shots (disc reconsidering this) Supportive care-flu /rest  Ibuprofen with food for fever/aches  Will fill the tamiflu now (her pharmacy just opened)  Update if not starting to improve in a week or if worsening   Reassuring exam

## 2017-07-08 ENCOUNTER — Ambulatory Visit: Payer: Self-pay | Admitting: *Deleted

## 2017-07-08 ENCOUNTER — Encounter: Payer: Self-pay | Admitting: Internal Medicine

## 2017-07-08 ENCOUNTER — Ambulatory Visit: Payer: BLUE CROSS/BLUE SHIELD | Admitting: Internal Medicine

## 2017-07-08 ENCOUNTER — Ambulatory Visit: Payer: BLUE CROSS/BLUE SHIELD | Admitting: Family Medicine

## 2017-07-08 VITALS — BP 140/76 | HR 99 | Temp 98.9°F | Ht 70.0 in | Wt 215.0 lb

## 2017-07-08 DIAGNOSIS — K149 Disease of tongue, unspecified: Secondary | ICD-10-CM

## 2017-07-08 NOTE — Progress Notes (Signed)
Subjective:    Patient ID: Breanna Weber, female    DOB: 03-25-1966, 52 y.o.   MRN: [id number]  HPI Here due to tongue swelling  Has been an issue for a while--a few months Thinks she bites it when sleeping Has damage on left side of tongue Seems to protrude against her teeth Both sides affected---not the middle  No new medications or products No Rx--never tried mouth guard Sleeps alone--not aware of any apnea  Current Outpatient Medications on File Prior to Visit  Medication Sig Dispense Refill  . acetaminophen (TYLENOL) 650 MG CR tablet Take 650 mg by mouth every 8 (eight) hours as needed for pain.    Marland Kitchen ibuprofen (ADVIL,MOTRIN) 200 MG tablet Take 600 mg by mouth every 6 (six) hours as needed.     No current facility-administered medications on file prior to visit.     Allergies  Allergen Reactions  . Aspirin Other (See Comments)    nosebleed  . Ciprofloxacin Swelling    Lip swelling    Past Medical History:  Diagnosis Date  . Fibroids   . History of chicken pox   . Uterine fibroid    removed    Past Surgical History:  Procedure Laterality Date  . CESAREAN SECTION  2004  . GANGLION CYST EXCISION  x2  . MYOMECTOMY  2001  . [place]  2001  . UTERINE FIBROID SURGERY  2002    Family History  Problem Relation Age of Onset  . Hypertension Mother   . Asthma Mother   . Hypertension Maternal Grandmother   . Cancer Maternal Grandmother        ovarian cancer  . Stroke Maternal Grandmother   . Coronary artery disease Maternal Grandmother 65  . Thyroid disease Maternal Grandmother   . Diabetes Maternal Grandfather   . Hypertension Maternal Grandfather   . Thyroid disease Sister   . Thyroid disease Maternal Aunt     Social History   Socioeconomic History  . Marital status: Widowed    Spouse name: Not on file  . Number of children: Not on file  . Years of education: Not on file  . Highest education level: Not on file  Occupational  History  . Not on file  Social Needs  . Financial resource strain: Not on file  . Food insecurity:    Worry: Not on file    Inability: Not on file  . Transportation needs:    Medical: Not on file    Non-medical: Not on file  Tobacco Use  . Smoking status: Never Smoker  . Smokeless tobacco: Never Used  Substance and Sexual Activity  . Alcohol use: No    Alcohol/week: 0.0 oz    Comment: Occasional  . Drug use: No  . Sexual activity: Yes    Partners: Male    Birth control/protection: None  Lifestyle  . Physical activity:    Days per week: Not on file    Minutes per session: Not on file  . Stress: Not on file  Relationships  . Social connections:    Talks on phone: Not on file    Gets together: Not on file    Attends religious service: Not on file    Active member of club or organization: Not on file    Attends meetings of clubs or organizations: Not on file    Relationship status: Not on file  . Intimate partner violence:    Fear of current or ex partner: Not on  file    Emotionally abused: Not on file    Physically abused: Not on file    Forced sexual activity: Not on file  Other Topics Concern  . Not on file  Social History Narrative   Caffeine: none   Lives with son   Husband died 2009/07/29 in [place], moved to be closer to family.   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  Lymphadenopathy:    She has no cervical adenopathy.          Assessment & Plan:

## 2017-07-08 NOTE — Patient Instructions (Signed)
Please go for a dental evaluation.

## 2017-07-08 NOTE — Telephone Encounter (Signed)
I spoke with pt and she is not have difficulty breathing but she is having swelling at the back of the tongue that pt feels like is affecting her swallowing at times. Pt is anxious and would like to be seen ASAP; pt refuses ED. Pt to see Dr Silvio Pate 07/08/17 at 10:15.

## 2017-07-08 NOTE — Telephone Encounter (Signed)
See OV note.  

## 2017-07-08 NOTE — Telephone Encounter (Addendum)
Pt called stating that her is tongue swelling which started "a couple of months ago"; she said that initially she thought that she was biting her tongue when she was asleep but now it seems like her tongue is larger and is bumping into her teeth"; nurse triage initiated and recommendations made per protocol to include seeing a physician within 3 days;pt previously offered and accepted appointment with Dr Dionisio Paschal, [place], per [place],  today at 1445; she verbalizes understanding; will route to office for notification of this upcoming appointment.  Reason for Disposition . [1] Swelling of tongue is a recurrent problem AND [2] no swelling at present  Answer Assessment - Initial Assessment Questions 1. ONSET: "When did the swelling start?" (e.g., minutes, hours, days)     A couple of months ago 2. LOCATION: "What part of the tongue is swollen?"   The sides 3. SEVERITY: "How swollen is it?"     To the point to when she talks it hits her teeth, "feels like it's too big for my mouth" 4. CAUSE: "What do you think is causing the tongue swelling?" (e.g., hx of angioedema, allergies)     no 5. RECURRENT SYMPTOM: "Have you had tongue swelling before?" If so, ask: "When was the last time?" "What happened that time?"     no 6. OTHER SYMPTOMS: "Do you have any other symptoms?" (e.g., difficulty breathing, facial swelling)     no 7. PREGNANCY: "Is there any chance you are pregnant?" "When was your last menstrual period?"     No post menopause  Protocols used: TONGUE SWELLING-A-AH

## 2017-07-08 NOTE — Assessment & Plan Note (Signed)
Seems mechanical Advised her to get dental evaluation Try OTC mouth/teeth guard---may need custom one if that doesn't work Nothing to suggest allergic reaction or worrisome tongue lesion

## 2017-08-15 ENCOUNTER — Ambulatory Visit: Payer: Self-pay | Admitting: *Deleted

## 2017-08-15 NOTE — Telephone Encounter (Signed)
Pt is experiencing tiredness , sweating a lot, not 100%. Isn't sure if it's blood sugar or menopause Call to patient - she is having sweating that is getting worse and coming out of nowhere. She is concerned and wants to make sure she is not developing diabetes.  Reason for Disposition . [1] NIGHT SWEATS occur (e.g., drenching sweat that occurs at night and has to change bed clothes or bed sheets) AND [2] cause unknown  Answer Assessment - Initial Assessment Questions 1. ONSET: "When did the sweating start?"      Intermittent- 1 year- getting worse 2. LOCATION: "What part of your body has excessive sweating?" (e.g., entire body; just face, underarms, palms, or soles of feet).      forehead 3. SEVERITY: "How bad is the sweating?"    (Scale 1-10; or mild, moderate, severe)   -  MILD (1-3): doesn't interfere with normal activities    -  MODERATE (4-7): interferes with normal activities (e.g., work or school) or awakens from sleep; causes embarrassment in social situations    -  SEVERE (8-10): drenching sweats and has to change bed clothes or bed linens.     Moderate- comes out of the blue 4. CAUSE: "What do you think is causing the sweating?"     Possible menopause/ diabetes 5. FEVER: "Have you been having fevers?"     no 6. OTHER SYMPTOMS: "Do you have any other symptoms?" (e.g., chest pain, difficulty breathing, lightheadedness, weight loss)     Lightheadedness, weight loss- patient is trying to lose  Protocols used: [facility]

## 2017-08-16 ENCOUNTER — Ambulatory Visit: Payer: BLUE CROSS/BLUE SHIELD | Admitting: Family Medicine

## 2017-08-16 ENCOUNTER — Encounter: Payer: Self-pay | Admitting: Family Medicine

## 2017-08-16 ENCOUNTER — Other Ambulatory Visit: Payer: Self-pay

## 2017-08-16 VITALS — BP 110/74 | HR 68 | Temp 98.6°F | Ht 70.0 in | Wt 219.0 lb

## 2017-08-16 DIAGNOSIS — Z780 Asymptomatic menopausal state: Secondary | ICD-10-CM | POA: Diagnosis not present

## 2017-08-16 DIAGNOSIS — R232 Flushing: Secondary | ICD-10-CM | POA: Diagnosis not present

## 2017-08-16 DIAGNOSIS — Z13220 Encounter for screening for lipoid disorders: Secondary | ICD-10-CM | POA: Diagnosis not present

## 2017-08-16 DIAGNOSIS — Z131 Encounter for screening for diabetes mellitus: Secondary | ICD-10-CM

## 2017-08-16 DIAGNOSIS — R42 Dizziness and giddiness: Secondary | ICD-10-CM | POA: Diagnosis not present

## 2017-08-16 LAB — COMPREHENSIVE METABOLIC PANEL
ALBUMIN: 4.1 g/dL (ref 3.5–5.2)
ALK PHOS: 84 U/L (ref 39–117)
ALT: 10 U/L (ref 0–35)
AST: 17 U/L (ref 0–37)
BUN: 13 mg/dL (ref 6–23)
CHLORIDE: 102 meq/L (ref 96–112)
CO2: 28 mEq/L (ref 19–32)
Calcium: 9.5 mg/dL (ref 8.4–10.5)
Creatinine, Ser: 0.68 mg/dL (ref 0.40–1.20)
GFR: 115.87 mL/min (ref >60.00)
GLUCOSE: 82 mg/dL (ref 70–99)
POTASSIUM: 3.9 meq/L (ref 3.5–5.1)
Sodium: 138 mEq/L (ref 135–145)
TOTAL PROTEIN: 8.5 g/dL — AB (ref 6.0–8.3)
Total Bilirubin: 0.4 mg/dL (ref 0.2–1.2)

## 2017-08-16 LAB — CBC WITH DIFFERENTIAL/PLATELET
Basophils Absolute: 0 10*3/uL (ref 0.0–0.1)
Basophils Relative: 1.1 % (ref 0.0–3.0)
EOS ABS: 0.1 10*3/uL (ref 0.0–0.7)
Eosinophils Relative: 1.4 % (ref 0.0–5.0)
HCT: 36.9 % (ref 36.0–46.0)
HEMOGLOBIN: 12.4 g/dL (ref 12.0–15.0)
LYMPHS PCT: 37.4 % (ref 12.0–46.0)
Lymphs Abs: 1.7 10*3/uL (ref 0.7–4.0)
MCHC: 33.6 g/dL (ref 30.0–36.0)
MCV: 90.3 fl (ref 78.0–100.0)
MONO ABS: 0.3 10*3/uL (ref 0.1–1.0)
Monocytes Relative: 7.8 % (ref 3.0–12.0)
Neutro Abs: 2.3 10*3/uL (ref 1.4–7.7)
Neutrophils Relative %: 52.3 % (ref 43.0–77.0)
Platelets: 280 10*3/uL (ref 150.0–400.0)
RBC: 4.09 Mil/uL (ref 3.87–5.11)
RDW: 12.8 % (ref 11.5–15.5)
WBC: 4.5 10*3/uL (ref 4.0–10.5)

## 2017-08-16 LAB — LIPID PANEL
Cholesterol: 215 mg/dL — ABNORMAL HIGH (ref 0–200)
HDL: 76.5 mg/dL (ref >39.00)
LDL CALC: 129 mg/dL — AB (ref 0–99)
NonHDL: 138.65
Total CHOL/HDL Ratio: 3
Triglycerides: 50 mg/dL (ref 0.0–149.0)
VLDL: 10 mg/dL (ref 0.0–40.0)

## 2017-08-16 LAB — TSH: TSH: 1.7 u[IU]/mL (ref 0.35–4.50)

## 2017-08-16 LAB — HEMOGLOBIN A1C: HEMOGLOBIN A1C: 5.2 % (ref 4.6–6.5)

## 2017-08-16 LAB — T4, FREE: Free T4: 0.85 ng/dL (ref 0.60–1.60)

## 2017-08-16 LAB — T3, FREE: T3 FREE: 4 pg/mL (ref 2.3–4.2)

## 2017-08-16 LAB — VITAMIN B12: VITAMIN B 12: 375 pg/mL (ref 211–911)

## 2017-08-16 NOTE — Patient Instructions (Signed)
Please stop at the lab to have labs drawn.  If not improving over time and lab nml .Marland Kitchen Follow up with PCP.

## 2017-08-16 NOTE — Assessment & Plan Note (Signed)
Nml neuro exam. No sign of vertigo. No clear cardiac source. Nml Vitals No red flags.  Eval with labs.

## 2017-08-16 NOTE — Progress Notes (Signed)
Subjective:    Patient ID: Breanna Weber, female    DOB: [birth year], 52 y.o.   MRN: [id number]  HPI    52 year old female present for  new onset hot flashes and dizziness.  She is post menopausal.. Last menses 07/2015. Has hot flashes since then off and on.   She had episode of lightheadedness and fatigue 2 days ago.  Lasted all night. Describes dizziness as "wooziness" Denies vertigo.  Had eaten several hour earlier..  No chest pain, mild SOB.  Started to get nervous and had palpitations.  Since then she has felt slightly lightheaded, but much better overall.  Has feeling anxious about it.   She has been tired in alst few weeks.   She has had similar in past associated with dehydration.  Review of Systems  Constitutional: Negative for fatigue and fever.  HENT: Negative for congestion.   Eyes: Negative for pain.  Respiratory: Negative for cough and shortness of breath.   Cardiovascular: Negative for chest pain, palpitations and leg swelling.  Gastrointestinal: Negative for abdominal pain.  Genitourinary: Negative for dysuria and vaginal bleeding.  Musculoskeletal: Negative for back pain.  Neurological: Negative for syncope, light-headedness and headaches.  Psychiatric/Behavioral: Negative for dysphoric mood. The patient is nervous/anxious.        Objective:   Physical Exam  Constitutional: She is oriented to person, place, and time. Vital signs are normal. She appears well-developed and well-nourished. She is cooperative.  Non-toxic appearance. She does not appear ill. No distress.  HENT:  Head: Normocephalic.  Right Ear: Hearing, tympanic membrane, external ear and ear canal normal. Tympanic membrane is not erythematous, not retracted and not bulging.  Left Ear: Hearing, tympanic membrane, external ear and ear canal normal. Tympanic membrane is not erythematous, not retracted and not bulging.  Nose: No mucosal edema or rhinorrhea. Right sinus exhibits no maxillary sinus  tenderness and no frontal sinus tenderness. Left sinus exhibits no maxillary sinus tenderness and no frontal sinus tenderness.  Mouth/Throat: Uvula is midline, oropharynx is clear and moist and mucous membranes are normal.  Eyes: Pupils are equal, round, and reactive to light. Conjunctivae, EOM and lids are normal. Lids are everted and swept, no foreign bodies found.  Neck: Trachea normal and normal range of motion. Neck supple. Carotid bruit is not present. No thyroid mass and no thyromegaly present.  Cardiovascular: Normal rate, regular rhythm, S1 normal, S2 normal, normal heart sounds, intact distal pulses and normal pulses. Exam reveals no gallop and no friction rub.  No murmur heard. Pulmonary/Chest: Effort normal and breath sounds normal. No tachypnea. No respiratory distress. She has no decreased breath sounds. She has no wheezes. She has no rhonchi. She has no rales.  Abdominal: Soft. Normal appearance and bowel sounds are normal. There is no tenderness.  Neurological: She is alert and oriented to person, place, and time. She has normal strength and normal reflexes. No cranial nerve deficit or sensory deficit. She exhibits normal muscle tone. She displays a negative Romberg sign. Coordination and gait normal. GCS eye subscore is 4. GCS verbal subscore is 5. GCS motor subscore is 6.  Nml cerebellar exam   No papilledema  Skin: Skin is warm, dry and intact. No rash noted.  Psychiatric: She has a normal mood and affect. Her speech is normal and behavior is normal. Judgment and thought content normal. Her mood appears not anxious. Cognition and memory are normal. Cognition and memory are not impaired. She does not exhibit a depressed mood. She  exhibits normal recent memory and normal remote memory.          Assessment & Plan:

## 2017-08-16 NOTE — Assessment & Plan Note (Signed)
Most likely due to post menopausal state  But will eval with labs.

## 2017-08-19 ENCOUNTER — Ambulatory Visit: Payer: BLUE CROSS/BLUE SHIELD | Admitting: Family Medicine

## 2017-08-19 ENCOUNTER — Ambulatory Visit: Payer: BLUE CROSS/BLUE SHIELD | Admitting: Internal Medicine

## 2018-02-06 ENCOUNTER — Ambulatory Visit (INDEPENDENT_AMBULATORY_CARE_PROVIDER_SITE_OTHER): Payer: BLUE CROSS/BLUE SHIELD

## 2018-02-06 DIAGNOSIS — Z23 Encounter for immunization: Secondary | ICD-10-CM

## 2018-09-21 ENCOUNTER — Telehealth: Payer: BC Managed Care – PPO | Admitting: Physician Assistant

## 2018-09-21 DIAGNOSIS — B3731 Acute candidiasis of vulva and vagina: Secondary | ICD-10-CM

## 2018-09-21 DIAGNOSIS — B373 Candidiasis of vulva and vagina: Secondary | ICD-10-CM | POA: Diagnosis not present

## 2018-09-21 MED ORDER — FLUCONAZOLE 150 MG PO TABS: 150.0000 mg | ORAL_TABLET | Freq: Once | ORAL | 0 refills | Status: AC

## 2018-09-21 NOTE — Progress Notes (Signed)

## 2018-09-21 NOTE — Progress Notes (Signed)
I have spent 5 minutes in review of e-visit questionnaire, review and updating patient chart, medical decision making and response to patient.   Aryan Bello Cody Georgean Spainhower, PA-C    

## 2018-11-07 DIAGNOSIS — U071 COVID-19: Secondary | ICD-10-CM

## 2018-11-07 DIAGNOSIS — Z03818 Encounter for observation for suspected exposure to other biological agents ruled out: Secondary | ICD-10-CM | POA: Diagnosis not present

## 2018-11-07 DIAGNOSIS — J019 Acute sinusitis, unspecified: Secondary | ICD-10-CM | POA: Diagnosis not present

## 2018-11-07 DIAGNOSIS — R6889 Other general symptoms and signs: Secondary | ICD-10-CM | POA: Diagnosis not present

## 2018-11-07 HISTORY — DX: COVID-19: U07.1

## 2018-11-17 ENCOUNTER — Encounter: Payer: Self-pay | Admitting: Emergency Medicine

## 2018-11-17 ENCOUNTER — Other Ambulatory Visit: Payer: Self-pay

## 2018-11-17 ENCOUNTER — Emergency Department
Admission: EM | Admit: 2018-11-17 | Discharge: 2018-11-18 | Disposition: A | Discharge status: Home / Self Care | Payer: BC Managed Care – PPO | Attending: Emergency Medicine | Admitting: Emergency Medicine

## 2018-11-17 DIAGNOSIS — R682 Dry mouth, unspecified: Secondary | ICD-10-CM | POA: Insufficient documentation

## 2018-11-17 DIAGNOSIS — F418 Other specified anxiety disorders: Secondary | ICD-10-CM | POA: Diagnosis not present

## 2018-11-17 DIAGNOSIS — F419 Anxiety disorder, unspecified: Secondary | ICD-10-CM | POA: Diagnosis not present

## 2018-11-17 DIAGNOSIS — U071 COVID-19: Secondary | ICD-10-CM | POA: Diagnosis not present

## 2018-11-17 NOTE — ED Triage Notes (Signed)
Pt reports she was COVID + on 11/07/18. Pt to ED tonight due to dry mouth and anxiousness. Pt denies SOB and chest pain.

## 2018-11-18 ENCOUNTER — Encounter: Payer: Self-pay | Admitting: Emergency Medicine

## 2018-11-18 MED ORDER — ONDANSETRON 4 MG PO TBDP: ORAL_TABLET | ORAL | 0 refills | Status: DC

## 2018-11-18 NOTE — ED Provider Notes (Signed)
[facility] Emergency Department Provider Note  ____________________________________________   First MD Initiated Contact with Patient 11/17/18 2337     (approximate)  I have reviewed the triage vital signs and the nursing notes.   HISTORY  Chief Complaint Anxiety    HPI Breanna Weber is a 53 y.o. female with recent diagnosis of COVID-19 diagnosed as an outpatient about 2 weeks ago.  She presents for evaluation of dry mouth and anxiety.  She says that she is on a number of medications including decongestants and antibiotics.  She thinks maybe it is the medications but she was at home tonight with a very dry mouth and felt anxious about her condition and just wanted to be checked out to make sure she was okay.  She no longer is having any fevers or chills.  No body aches.  No chest pain or shortness of breath.  No cough.  No sore throat.  The symptoms for which she is presenting are moderate and she says she is already feeling better after getting here.        Past Medical History:  Diagnosis Date   COVID-19 virus detected 11/07/2018   outpatient swab, dx'd on 11/07/2018   Fibroids    History of chicken pox    Uterine fibroid    removed    Patient Active Problem List   Diagnosis Date Noted   Hot flashes 08/16/2017   Dizziness 08/16/2017   Tongue irritation 07/08/2017   Influenza A 06/03/2017   Knee pain, right 11/19/2015   Obesity, Class I, BMI 30-34.9 05/12/2015   HLD (hyperlipidemia) 05/12/2015   Left shoulder pain 05/12/2015   Decreased visual acuity 05/12/2015   DUB (dysfunctional uterine bleeding) 12/21/2011   Fibroids    Healthcare maintenance 07/12/2011    Past Surgical History:  Procedure Laterality Date   CESAREAN SECTION  2004   GANGLION CYST EXCISION  x2   MYOMECTOMY  2001   [place]  2001   UTERINE FIBROID SURGERY  2002    Prior to Admission medications   Medication Sig Start  Date End Date Taking? Authorizing Provider  acetaminophen (TYLENOL) 650 MG CR tablet Take 650 mg by mouth every 8 (eight) hours as needed for pain.    [provider]  ibuprofen (ADVIL,MOTRIN) 200 MG tablet Take 600 mg by mouth every 6 (six) hours as needed.    [provider]  ondansetron (ZOFRAN ODT) 4 MG disintegrating tablet Allow 1-2 tablets to dissolve in your mouth every 8 hours as needed for nausea/vomiting 11/18/18   Hinda Kehr, MD    Allergies Aspirin and Ciprofloxacin  Family History  Problem Relation Age of Onset   Hypertension Mother    Asthma Mother    Hypertension Maternal Grandmother    Cancer Maternal Grandmother        ovarian cancer   Stroke Maternal Grandmother    Coronary artery disease Maternal Grandmother 42   Thyroid disease Maternal Grandmother    Diabetes Maternal Grandfather    Hypertension Maternal Grandfather    Thyroid disease Sister    Thyroid disease Maternal Aunt     Social History Social History   Tobacco Use   Smoking status: Never Smoker   Smokeless tobacco: Never Used  Substance Use Topics   Alcohol use: No    Alcohol/week: 0.0 standard drinks    Comment: Occasional   Drug use: No    Review of Systems Constitutional: No fever/chills ENT: Dry mouth.  No sore  throat. Cardiovascular: Denies chest pain. Respiratory: Denies shortness of breath. Musculoskeletal: No body aches.  Negative for neck pain.  Negative for back pain. Neurological: Negative for headaches, focal weakness or numbness. Psychiatric:  Anxiety.  ____________________________________________   PHYSICAL EXAM:  VITAL SIGNS: ED Triage Vitals [11/17/18 2334]  Enc Vitals Group     BP 117/77     Pulse Rate 92     Resp 18     Temp 97.6 F (36.4 C)     Temp Source Oral     SpO2 97 %     Weight      Height      Head Circumference      Peak Flow      Pain Score      Pain Loc      Pain Edu?      Excl. in [place]?      Constitutional: Alert and oriented.  No acute distress, well-appearing, speaking in full improvement in symptoms, no hoarse voice. Eyes: Conjunctivae are normal.  Head: Atraumatic. Neck: No stridor.  No meningeal signs.   Cardiovascular: Normal rate. Respiratory: Normal respiratory effort.  No retractions.  No tachypnea. Musculoskeletal: No gross deformities of extremities. Neurologic:  Normal speech and language. No gross focal neurologic deficits are appreciated.  Skin:  Skin is warm, dry and intact. Psychiatric: Mood and affect are normal. Speech and behavior are normal.  ____________________________________________   LABS (all labs ordered are listed, but only abnormal results are displayed)  Labs Reviewed - No data to display ____________________________________________  EKG  No indication for EKG ____________________________________________  RADIOLOGY I, Hinda Kehr, personally viewed and evaluated these images (plain radiographs) as part of my medical decision making, as well as reviewing the written report by the radiologist.  ED MD interpretation: No indication for imaging  Official radiology report(s): No results found.  ____________________________________________   PROCEDURES   Procedure(s) performed (including Critical Care):  Procedures   ____________________________________________   INITIAL IMPRESSION / MDM / [place] / ED COURSE  As part of my medical decision making, I reviewed the following data within the [place] notes reviewed and incorporated, Labs reviewed , Old chart reviewed and Notes from prior ED visits   Patient is anxious about her medical condition.  She has no signs or symptoms of an emergent medical condition at this time.  Her vital signs are stable and she is in no distress, breathing comfortably and easily, able to speak in full sentences with a normal voice behind her mask.  We had a  discussion about warning signs and symptoms, risk factors that may contribute to bad outcomes (which she does not have, such as tobacco history and morbid obesity), and I provided reassurance and also explained that I had no other treatment options for her at this time.  We agreed that most likely her dry mouth is from the medication she is taking but I encouraged her to continue taking her medications and drink plenty of fluids to stay hydrated.  I gave my usual customary return precautions and she understands and agrees with the plan.          ____________________________________________  FINAL CLINICAL IMPRESSION(S) / ED DIAGNOSES  Final diagnoses:  Anxiety about health  COVID-19 virus infection     MEDICATIONS GIVEN DURING THIS VISIT:  Medications - No data to display   ED Discharge Orders         Ordered    ondansetron (ZOFRAN ODT)  4 MG disintegrating tablet     11/18/18 0043          *Please note:  Zahira Brummond was evaluated in Emergency Department on 11/18/2018 for the symptoms described in the history of present illness. She was evaluated in the context of the global COVID-19 pandemic, which necessitated consideration that the patient might be at risk for infection with the SARS-CoV-2 virus that causes COVID-19. Institutional protocols and algorithms that pertain to the evaluation of patients at risk for COVID-19 are in a state of rapid change based on information released by regulatory bodies including the CDC and federal and state organizations. These policies and algorithms were followed during the patient's care in the ED.  Some ED evaluations and interventions may be delayed as a result of limited staffing during the pandemic.*  Note:  This document was prepared using Dragon voice recognition software and may include unintentional dictation errors.   Hinda Kehr, MD 11/18/18 [phone]

## 2018-11-18 NOTE — ED Notes (Signed)
Pt ambulatory to room with no difficulty. Pt reports anxiety tonight related to recently being Dx'd with COVID. Pt also reports nauseated but has not vomited. Resp even and unlabored. No other co's voiced.

## 2018-11-18 NOTE — Discharge Instructions (Addendum)
As we discussed, I will I certainly understand your concern over your recent COVID-19 diagnosis.  Fortunately your vital signs are normal tonight and you have no signs or symptoms of severe infection that would require hospitalization or additional aggressive treatment.  Please continue taking the medications you have been prescribed and be sure to drink plenty of fluids.  Treat this as you would a case of the flu or a bad cold.  Continue to isolate yourself and your immediate family as much as possible from everyone else.  I prescribed you a course of Zofran which you can use as needed for nausea at home.  Return to the emergency department if you develop new or worsening symptoms such as severe shortness of breath, chest pain, inability to eat or drink due to persistent nausea and vomiting, etc.

## 2019-02-13 ENCOUNTER — Ambulatory Visit (INDEPENDENT_AMBULATORY_CARE_PROVIDER_SITE_OTHER): Payer: BC Managed Care – PPO | Admitting: Family Medicine

## 2019-02-13 ENCOUNTER — Encounter: Payer: Self-pay | Admitting: Family Medicine

## 2019-02-13 VITALS — Ht 70.0 in | Wt 225.0 lb

## 2019-02-13 DIAGNOSIS — R253 Fasciculation: Secondary | ICD-10-CM | POA: Diagnosis not present

## 2019-02-13 DIAGNOSIS — E785 Hyperlipidemia, unspecified: Secondary | ICD-10-CM | POA: Diagnosis not present

## 2019-02-13 DIAGNOSIS — R519 Headache, unspecified: Secondary | ICD-10-CM | POA: Insufficient documentation

## 2019-02-13 NOTE — Assessment & Plan Note (Signed)
Of R upper cheek, not too bothersome - with overall normal limited physical exam anticipate benign self limited cause. rec warm compress, good water intake, update if persists.

## 2019-02-13 NOTE — Progress Notes (Signed)
Virtual visit completed through [employer]. Due to national recommendations of social distancing due to COVID-19, a virtual visit is felt to be most appropriate for this patient at this time. Reviewed limitations of a virtual visit.   Patient location: home Provider location: [place] at [facility], office If any vitals were documented, they were collected by patient at home unless specified below.    Ht _0  (1.778 m)   Wt 225 lb (102.1 kg) Comment: pt did not weight - estimated weight  BMI 32.28 kg/m    CC: R eye spasm Subjective:    Patient ID: Breanna Weber, female    DOB: [birth year], 53 y.o.   MRN: [id number]  HPI: Breanna Weber is a 53 y.o. female presenting on 02/13/2019 for Right eye spasm (Flutter feeling under right eye, headache on right side as well. Started on 02/09/2019. Pt does not recall injuring. Some itching. No noted redness or pain. )   3d h/o spasm below R eye associated with mild temporal headache described as intermittent throbbing headache. Feels more pressure when leaning had forward. Twitching of upper cheek worse on Friday then has subsided a lot. Notes more inciting when sleeping on right side. Bright lights of stress hasn't really affected this. Tried tylenol for this. Some intermittent neck pain. No sinus pain or pressure. No h/o migraines. No photophobia. No nausea.   Some vision changes noted - due for eye exam.      Relevant past medical, surgical, family and social history reviewed and updated as indicated. Interim medical history since our last visit reviewed. Allergies and medications reviewed and updated. Outpatient Medications Prior to Visit  Medication Sig Dispense Refill  . acetaminophen (TYLENOL) 650 MG CR tablet Take 650 mg by mouth every 8 (eight) hours as needed for pain.    Marland Kitchen ibuprofen (ADVIL,MOTRIN) 200 MG tablet Take 600 mg by mouth every 6 (six) hours as needed.    . ondansetron (ZOFRAN ODT) 4 MG disintegrating tablet Allow 1-2 tablets  to dissolve in your mouth every 8 hours as needed for nausea/vomiting 30 tablet 0   No facility-administered medications prior to visit.      Per HPI unless specifically indicated in ROS section below Review of Systems Objective:    Ht _1  (1.778 m)   Wt 225 lb (102.1 kg) Comment: pt did not weight - estimated weight  BMI 32.28 kg/m   Wt Readings from Last 3 Encounters:  02/13/19 225 lb (102.1 kg)  08/16/17 219 lb (99.3 kg)  07/08/17 215 lb (97.5 kg)     Physical exam: Gen: alert, NAD, not ill appearing HEENT - no obvious blepharospasm or muscle twitching at this time Pulm: speaks in complete sentences without increased work of breathing Psych: normal mood, normal thought content      Results for orders placed or performed in visit on 08/16/17  Lipid panel  Result Value Ref Range   Cholesterol 215 (H) 0 - 200 mg/dL   Triglycerides 50.0 0.0 - 149.0 mg/dL   HDL 76.50 >39.00 mg/dL   VLDL 10.0 0.0 - 40.0 mg/dL   LDL Cholesterol 129 (H) 0 - 99 mg/dL   Total CHOL/HDL Ratio 3    NonHDL 138.65   Comprehensive metabolic panel  Result Value Ref Range   Sodium 138 135 - 145 mEq/L   Potassium 3.9 3.5 - 5.1 mEq/L   Chloride 102 96 - 112 mEq/L   CO2 28 19 - 32 mEq/L   Glucose, Bld 82 70 -  99 mg/dL   BUN 13 6 - 23 mg/dL   Creatinine, Ser 0.68 0.40 - 1.20 mg/dL   Total Bilirubin 0.4 0.2 - 1.2 mg/dL   Alkaline Phosphatase 84 39 - 117 U/L   AST 17 0 - 37 U/L   ALT 10 0 - 35 U/L   Total Protein 8.5 (H) 6.0 - 8.3 g/dL   Albumin 4.1 3.5 - 5.2 g/dL   Calcium 9.5 8.4 - 10.5 mg/dL   GFR 115.87 >60.00 mL/min  Hemoglobin A1c  Result Value Ref Range   Hgb A1c MFr Bld 5.2 4.6 - 6.5 %  T4, free  Result Value Ref Range   Free T4 0.85 0.60 - 1.60 ng/dL  T3, free  Result Value Ref Range   T3, Free 4.0 2.3 - 4.2 pg/mL  CBC with Differential/Platelet  Result Value Ref Range   WBC 4.5 4.0 - 10.5 K/uL   RBC 4.09 3.87 - 5.11 Mil/uL   Hemoglobin 12.4 12.0 - 15.0 g/dL   HCT 36.9 36.0 -  46.0 %   MCV 90.3 78.0 - 100.0 fl   MCHC 33.6 30.0 - 36.0 g/dL   RDW 12.8 11.5 - 15.5 %   Platelets 280.0 150.0 - 400.0 K/uL   Neutrophils Relative % 52.3 43.0 - 77.0 %   Lymphocytes Relative 37.4 12.0 - 46.0 %   Monocytes Relative 7.8 3.0 - 12.0 %   Eosinophils Relative 1.4 0.0 - 5.0 %   Basophils Relative 1.1 0.0 - 3.0 %   Neutro Abs 2.3 1.4 - 7.7 K/uL   Lymphs Abs 1.7 0.7 - 4.0 K/uL   Monocytes Absolute 0.3 0.1 - 1.0 K/uL   Eosinophils Absolute 0.1 0.0 - 0.7 K/uL   Basophils Absolute 0.0 0.0 - 0.1 K/uL  Vitamin B12  Result Value Ref Range   Vitamin B-12 375 211 - 911 pg/mL  TSH  Result Value Ref Range   TSH 1.70 0.35 - 4.50 uIU/mL   Assessment & Plan:   Problem List Items Addressed This Visit    Muscle twitching - Primary    Of R upper cheek, not too bothersome - with overall normal limited physical exam anticipate benign self limited cause. rec warm compress, good water intake, update if persists.       HLD (hyperlipidemia)   Relevant Orders   Lipid panel   Comprehensive metabolic panel   Headache    Not consistent with migraine. Suggested tylenol/aleve PRN, get vision checked (?eye strain), change pillows. If ongoing, consider checking ESR to r/o GCA. Reassess at CPE next month.       Relevant Orders   Sedimentation rate       No orders of the defined types were placed in this encounter.  Orders Placed This Encounter  Procedures  . Lipid panel    Standing Status:   Future    Standing Expiration Date:   02/13/2020  . Comprehensive metabolic panel    Standing Status:   Future    Standing Expiration Date:   02/13/2020  . Sedimentation rate    Standing Status:   Future    Standing Expiration Date:   02/13/2020    I discussed the assessment and treatment plan with the patient. The patient was provided an opportunity to ask questions and all were answered. The patient agreed with the plan and demonstrated an understanding of the instructions. The patient was  advised to call back or seek an in-person evaluation if the symptoms worsen or if the condition  fails to improve as anticipated.  Follow up plan: No follow-ups on file.  Ria Bush, MD

## 2019-02-13 NOTE — Assessment & Plan Note (Signed)
Not consistent with migraine. Suggested tylenol/aleve PRN, get vision checked (?eye strain), change pillows. If ongoing, consider checking ESR to r/o GCA. Reassess at CPE next month.

## 2019-02-28 ENCOUNTER — Ambulatory Visit (INDEPENDENT_AMBULATORY_CARE_PROVIDER_SITE_OTHER): Payer: BC Managed Care – PPO

## 2019-02-28 ENCOUNTER — Other Ambulatory Visit (INDEPENDENT_AMBULATORY_CARE_PROVIDER_SITE_OTHER): Payer: BC Managed Care – PPO

## 2019-02-28 DIAGNOSIS — Z23 Encounter for immunization: Secondary | ICD-10-CM

## 2019-02-28 DIAGNOSIS — E785 Hyperlipidemia, unspecified: Secondary | ICD-10-CM | POA: Diagnosis not present

## 2019-02-28 DIAGNOSIS — R519 Headache, unspecified: Secondary | ICD-10-CM | POA: Diagnosis not present

## 2019-02-28 LAB — COMPREHENSIVE METABOLIC PANEL
ALT: 10 U/L (ref 0–35)
AST: 15 U/L (ref 0–37)
Albumin: 4.1 g/dL (ref 3.5–5.2)
Alkaline Phosphatase: 107 U/L (ref 39–117)
BUN: 17 mg/dL (ref 6–23)
CO2: 28 mEq/L (ref 19–32)
Calcium: 9.5 mg/dL (ref 8.4–10.5)
Chloride: 103 mEq/L (ref 96–112)
Creatinine, Ser: 0.8 mg/dL (ref 0.40–1.20)
GFR: 89.87 mL/min (ref >60.00)
Glucose, Bld: 91 mg/dL (ref 70–99)
Potassium: 4.1 mEq/L (ref 3.5–5.1)
Sodium: 136 mEq/L (ref 135–145)
Total Bilirubin: 0.3 mg/dL (ref 0.2–1.2)
Total Protein: 8.4 g/dL — ABNORMAL HIGH (ref 6.0–8.3)

## 2019-02-28 LAB — LIPID PANEL
Cholesterol: 197 mg/dL (ref 0–200)
HDL: 58.2 mg/dL (ref >39.00)
LDL Cholesterol: 125 mg/dL — ABNORMAL HIGH (ref 0–99)
NonHDL: 138.56
Total CHOL/HDL Ratio: 3
Triglycerides: 67 mg/dL (ref 0.0–149.0)
VLDL: 13.4 mg/dL (ref 0.0–40.0)

## 2019-02-28 LAB — SEDIMENTATION RATE: Sed Rate: 49 mm/hr — ABNORMAL HIGH (ref 0–30)

## 2019-02-28 NOTE — Progress Notes (Signed)
Per orders of Dr. Ria Bush, injection of Flu shot given by Lurlean Nanny.  Patient tolerated injection well.

## 2019-03-19 ENCOUNTER — Ambulatory Visit (INDEPENDENT_AMBULATORY_CARE_PROVIDER_SITE_OTHER): Payer: BC Managed Care – PPO | Admitting: Family Medicine

## 2019-03-19 ENCOUNTER — Ambulatory Visit: Payer: BC Managed Care – PPO | Admitting: Family Medicine

## 2019-03-19 ENCOUNTER — Other Ambulatory Visit: Payer: Self-pay

## 2019-03-19 ENCOUNTER — Encounter: Payer: Self-pay | Admitting: Family Medicine

## 2019-03-19 VITALS — BP 118/68 | HR 86 | Temp 98.2°F | Ht 67.75 in | Wt 234.2 lb

## 2019-03-19 DIAGNOSIS — E8809 Other disorders of plasma-protein metabolism, not elsewhere classified: Secondary | ICD-10-CM

## 2019-03-19 DIAGNOSIS — R779 Abnormality of plasma protein, unspecified: Secondary | ICD-10-CM

## 2019-03-19 DIAGNOSIS — M25561 Pain in right knee: Secondary | ICD-10-CM | POA: Diagnosis not present

## 2019-03-19 DIAGNOSIS — R700 Elevated erythrocyte sedimentation rate: Secondary | ICD-10-CM

## 2019-03-19 DIAGNOSIS — E785 Hyperlipidemia, unspecified: Secondary | ICD-10-CM | POA: Diagnosis not present

## 2019-03-19 DIAGNOSIS — M25562 Pain in left knee: Secondary | ICD-10-CM

## 2019-03-19 DIAGNOSIS — Z0000 Encounter for general adult medical examination without abnormal findings: Secondary | ICD-10-CM | POA: Diagnosis not present

## 2019-03-19 DIAGNOSIS — Z1211 Encounter for screening for malignant neoplasm of colon: Secondary | ICD-10-CM

## 2019-03-19 DIAGNOSIS — G8929 Other chronic pain: Secondary | ICD-10-CM

## 2019-03-19 NOTE — Progress Notes (Signed)
This visit was conducted in person.  BP 118/68 (BP Location: Left Arm, Patient Position: Sitting, Cuff Size: Large)   Pulse 86   Temp 98.2 F (36.8 C) (Temporal)   Ht 5' 7.75" (1.721 m)   Wt 234 lb 4 oz (106.3 kg)   SpO2 99%   BMI 35.88 kg/m    CC: CPE Subjective:    Patient ID: Breanna Weber, female    DOB: 1965/07/12, 53 y.o.   MRN: [id number]  HPI: Breanna Weber is a 53 y.o. female presenting on 03/19/2019 for Annual Exam   She did have Covid infection 10/2018 - mild symptoms (fever). Also treated for sinusitis with augmentin antibiotic.   Elevated inflammatory markers. Endorses chronic R>L knee pain from presumed OA.   Preventative: Colon cancer screening - iFOB normal 2017 - discussed, would like colonoscopy.  Well woman exam - 2017 Dr Harolyn Rutherford pap normal HPV neg. Off OCPs. H/o fibroids.  LMP: years ago Mammogram at [facility] 2017 - due for rpt.  Flu shot - yearly Tetanus - 2004.  Seat belt use discussed Sunscreen use discussed. No changing moles on skin. Non smoker Alcohol - rare Dentist yearly  Eye exam - yearly   Caffeine: none Lives with son Husband passed 2011. 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 Interim medical history since our last visit reviewed. Allergies and medications reviewed and updated. Outpatient Medications Prior to Visit  Medication Sig Dispense Refill  . acetaminophen (TYLENOL) 650 MG CR tablet Take 650 mg by mouth every 8 (eight) hours as needed for pain.    Marland Kitchen ibuprofen (ADVIL,MOTRIN) 200 MG tablet Take 600 mg by mouth every 6 (six) hours as needed.    . ondansetron (ZOFRAN ODT) 4 MG disintegrating tablet Allow 1-2 tablets to  dissolve in your mouth every 8 hours as needed for nausea/vomiting 30 tablet 0   No facility-administered medications prior to visit.      Per HPI unless specifically indicated in ROS section below Review of Systems  Constitutional: Negative for activity change, appetite change, chills, fatigue, fever and unexpected weight change.  HENT: Negative for hearing loss.   Eyes: Negative for visual disturbance.  Respiratory: Negative for cough, chest tightness, shortness of breath and wheezing.   Cardiovascular: Negative for chest pain, palpitations and leg swelling.  Gastrointestinal: Negative for abdominal distention, abdominal pain, blood in stool, constipation, diarrhea, nausea and vomiting.  Genitourinary: Negative for difficulty urinating and hematuria.  Musculoskeletal: Negative for arthralgias, myalgias and neck pain.  Skin: Negative for rash.  Neurological: Negative for dizziness, seizures, syncope and headaches.  Hematological: Negative for adenopathy. Does not bruise/bleed easily.  Psychiatric/Behavioral: Negative for dysphoric mood. The patient is not nervous/anxious.    Objective:    BP 118/68 (BP Location: Left Arm, Patient Position: Sitting, Cuff Size: Large)   Pulse 86   Temp 98.2 F (36.8 C) (Temporal)   Ht 5' 7.75" (1.721 m)   Wt 234 lb 4 oz (106.3 kg)   SpO2 99%   BMI 35.88 kg/m   Wt Readings from Last 3 Encounters:  03/19/19 234 lb 4 oz (106.3 kg)  02/13/19 225 lb (102.1 kg)  08/16/17 219 lb (99.3 kg)    Physical Exam Vitals signs and  nursing note reviewed.  Constitutional:      General: She is not in acute distress.    Appearance: Normal appearance. She is well-developed. She is not ill-appearing.  HENT:     Head: Normocephalic and atraumatic.     Right Ear: Hearing, tympanic membrane, ear canal and external ear normal.     Left Ear: Hearing, tympanic membrane, ear canal and external ear normal.     Nose: Nose normal.     Mouth/Throat:     Pharynx: Uvula  midline.  Eyes:     General: No scleral icterus.    Extraocular Movements: Extraocular movements intact.     Conjunctiva/sclera: Conjunctivae normal.     Pupils: Pupils are equal, round, and reactive to light.  Neck:     Musculoskeletal: Normal range of motion and neck supple.  Cardiovascular:     Rate and Rhythm: Normal rate and regular rhythm.     Pulses: Normal pulses.          Radial pulses are 2+ on the right side and 2+ on the left side.     Heart sounds: Normal heart sounds. No murmur.  Pulmonary:     Effort: Pulmonary effort is normal. No respiratory distress.     Breath sounds: Normal breath sounds. No wheezing, rhonchi or rales.  Abdominal:     General: Abdomen is flat. Bowel sounds are normal. There is no distension.     Palpations: Abdomen is soft. There is no mass.     Tenderness: There is no abdominal tenderness. There is no guarding or rebound.     Hernia: No hernia is present.  Musculoskeletal: Normal range of motion.     Right lower leg: No edema.     Left lower leg: No edema.  Lymphadenopathy:     Cervical: No cervical adenopathy.  Skin:    General: Skin is warm and dry.     Findings: No rash.  Neurological:     General: No focal deficit present.     Mental Status: She is alert and oriented to person, place, and time.     Comments: CN grossly intact, station and gait intact  Psychiatric:        Mood and Affect: Mood normal.        Behavior: Behavior normal.        Thought Content: Thought content normal.        Judgment: Judgment normal.       Results for orders placed or performed in visit on 03/19/19  HM MAMMOGRAPHY  Result Value Ref Range   HM Mammogram 0-4 Bi-Rad 0-4 Bi-Rad, Self Reported Normal   Assessment & Plan:  This visit occurred during the SARS-CoV-2 public health emergency.  Safety protocols were in place, including screening questions prior to the visit, additional usage of staff PPE, and extensive cleaning of exam room while observing  appropriate contact time as indicated for disinfecting solutions.   Problem List Items Addressed This Visit    Severe obesity (BMI 35.0-39.9) with comorbidity ([place])    Encouraged healthy diet and lifestyle changes to affect sustainable weight loss.       HLD (hyperlipidemia)    Chronic, stable off meds. The 10-year ASCVD risk score Mikey Bussing DC Jr., et al., 2013) is: 2.3%   Values used to calculate the score:     Age: 42 years     Sex: Female     Is Non-Hispanic African American: Yes     Diabetic: No  Tobacco smoker: No     Systolic Blood Pressure: 553 mmHg     Is BP treated: No     HDL Cholesterol: 58.2 mg/dL     Total Cholesterol: 197 mg/dL       Healthcare maintenance - Primary    Preventative protocols reviewed and updated unless pt declined. Discussed healthy diet and lifestyle.       Elevated sedimentation rate    RTC 4-6 mo recheck. ?Covid illness related persistent ESR elevation.       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 Encouraged low inflammatory diet.        Other Visit Diagnoses    Special screening for malignant neoplasms, colon       Relevant Orders   Ambulatory referral to Gastroenterology   Elevated blood protein       Relevant Orders   LFTs       No orders of the defined types were placed in this encounter.  Orders Placed This Encounter  Procedures  . HM MAMMOGRAPHY    This external order was created through the Results Console.  Marland Kitchen LFTs    Standing Status:   Future    Standing Expiration Date:   03/18/2020  . Sedimentation rate    Standing Status:   Future    Standing Expiration Date:   03/18/2020  . High sensitivity CRP    Standing Status:   Future    Standing Expiration Date:   03/18/2020  . CBC with Differential    Standing Status:   Future    Standing Expiration Date:   03/18/2020  . Ambulatory referral to  Gastroenterology    Referral Priority:   Routine    Referral Type:   Consultation    Referral Reason:   Specialty Services Required    Number of Visits Requested:   1   Patient instructions: We will refer you for colonoscopy.  Call [facility] imaging to schedule mammogram.  Call GYN for well woman exam.  Schedule lab visit in 4-6 months to recheck inflammation levels.  Return in 1 year for next physical, sooner if needed.   Follow up plan: Return in about 1 year (around 03/18/2020) for annual exam, prior fasting for blood work.  Ria Bush, MD

## 2019-03-19 NOTE — Assessment & Plan Note (Signed)
Chronic, stable off meds. The 10-year ASCVD risk score Mikey Bussing DC Brooke Bonito., et al., 2013) is: 2.3%   Values used to calculate the score:     Age: 53 years     Sex: Female     Is Non-Hispanic African American: Yes     Diabetic: No     Tobacco smoker: No     Systolic Blood Pressure: 123456 mmHg     Is BP treated: No     HDL Cholesterol: 58.2 mg/dL     Total Cholesterol: 197 mg/dL

## 2019-03-19 NOTE — Assessment & Plan Note (Signed)
Encouraged healthy diet and lifestyle changes to affect sustainable weight loss.  

## 2019-03-19 NOTE — Assessment & Plan Note (Signed)
RTC 4-6 mo recheck. ?Covid illness related persistent ESR elevation.

## 2019-03-19 NOTE — Assessment & Plan Note (Signed)
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 Encouraged low inflammatory diet.

## 2019-03-19 NOTE — Assessment & Plan Note (Signed)
Preventative protocols reviewed and updated unless pt declined. Discussed healthy diet and lifestyle.  

## 2019-03-19 NOTE — Patient Instructions (Addendum)
We will refer you for colonoscopy.  Call [facility] imaging to schedule mammogram.  Call GYN for well woman exam.  Schedule lab visit in 4-6 months to recheck inflammation levels.  Return in 1 year for next physical, sooner if needed.   Health Maintenance for Postmenopausal Women Menopause is a normal process in which your ability to get pregnant comes to an end. This process happens slowly over many months or years, usually between the ages of 55 and 34. Menopause is complete when you have missed your menstrual periods for 12 months. It is important to talk with your health care provider about some of the most common conditions that affect women after menopause (postmenopausal women). These include heart disease, cancer, and bone loss (osteoporosis). Adopting a healthy lifestyle and getting preventive care can help to promote your health and wellness. The actions you take can also lower your chances of developing some of these common conditions. What should I know about menopause? During menopause, you may get a number of symptoms, such as:  Hot flashes. These can be moderate or severe.  Night sweats.  Decrease in sex drive.  Mood swings.  Headaches.  Tiredness.  Irritability.  Memory problems.  Insomnia. Choosing to treat or not to treat these symptoms is a decision that you make with your health care provider. Do I need hormone replacement therapy?  Hormone replacement therapy is effective in treating symptoms that are caused by menopause, such as hot flashes and night sweats.  Hormone replacement carries certain risks, especially as you become older. If you are thinking about using estrogen or estrogen with progestin, discuss the benefits and risks with your health care provider. What is my risk for heart disease and stroke? The risk of heart disease, heart attack, and stroke increases as you age. One of the causes may be a change in the body's hormones during menopause. This can  affect how your body uses dietary fats, triglycerides, and cholesterol. Heart attack and stroke are medical emergencies. There are many things that you can do to help prevent heart disease and stroke. Watch your blood pressure  High blood pressure causes heart disease and increases the risk of stroke. This is more likely to develop in people who have high blood pressure readings, are of African descent, or are overweight.  Have your blood pressure checked: ? Every 3-5 years if you are 55-15 years of age. ? Every year if you are 55 years old or older. Eat a healthy diet   Eat a diet that includes plenty of vegetables, fruits, low-fat dairy products, and lean protein.  Do not eat a lot of foods that are high in solid fats, added sugars, or sodium. Get regular exercise Get regular exercise. This is one of the most important things you can do for your health. Most adults should:  Try to exercise for at least 150 minutes each week. The exercise should increase your heart rate and make you sweat (moderate-intensity exercise).  Try to do strengthening exercises at least twice each week. Do these in addition to the moderate-intensity exercise.  Spend less time sitting. Even light physical activity can be beneficial. Other tips  Work with your health care provider to achieve or maintain a healthy weight.  Do not use any products that contain nicotine or tobacco, such as cigarettes, e-cigarettes, and chewing tobacco. If you need help quitting, ask your health care provider.  Know your numbers. Ask your health care provider to check your cholesterol and your  blood sugar (glucose). Continue to have your blood tested as directed by your health care provider. Do I need screening for cancer? Depending on your health history and family history, you may need to have cancer screening at different stages of your life. This may include screening for:  Breast cancer.  Cervical cancer.  Lung  cancer.  Colorectal cancer. What is my risk for osteoporosis? After menopause, you may be at increased risk for osteoporosis. Osteoporosis is a condition in which bone destruction happens more quickly than new bone creation. To help prevent osteoporosis or the bone fractures that can happen because of osteoporosis, you may take the following actions:  If you are 55-52 years old, get at least 1,000 mg of calcium and at least 600 mg of vitamin D per day.  If you are older than age 23 but younger than age 55, get at least 1,200 mg of calcium and at least 600 mg of vitamin D per day.  If you are older than age 55, get at least 1,200 mg of calcium and at least 800 mg of vitamin D per day. Smoking and drinking excessive alcohol increase the risk of osteoporosis. Eat foods that are rich in calcium and vitamin D, and do weight-bearing exercises several times each week as directed by your health care provider. How does menopause affect my mental health? Depression may occur at any age, but it is more common as you become older. Common symptoms of depression include:  Low or sad mood.  Changes in sleep patterns.  Changes in appetite or eating patterns.  Feeling an overall lack of motivation or enjoyment of activities that you previously enjoyed.  Frequent crying spells. Talk with your health care provider if you think that you are experiencing depression. General instructions See your health care provider for regular wellness exams and vaccines. This may include:  Scheduling regular health, dental, and eye exams.  Getting and maintaining your vaccines. These include: ? Influenza vaccine. Get this vaccine each year before the flu season begins. ? Pneumonia vaccine. ? Shingles vaccine. ? Tetanus, diphtheria, and pertussis (Tdap) booster vaccine. Your health care provider may also recommend other immunizations. Tell your health care provider if you have ever been abused or do not feel safe at  home. Summary  Menopause is a normal process in which your ability to get pregnant comes to an end.  This condition causes hot flashes, night sweats, decreased interest in sex, mood swings, headaches, or lack of sleep.  Treatment for this condition may include hormone replacement therapy.  Take actions to keep yourself healthy, including exercising regularly, eating a healthy diet, watching your weight, and checking your blood pressure and blood sugar levels.  Get screened for cancer and depression. Make sure that you are up to date with all your vaccines. This information is not intended to replace advice given to you by your health care provider. Make sure you discuss any questions you have with your health care provider. Document Released: 05/21/2005 Document Revised: 03/22/2018 Document Reviewed: 03/22/2018 Elsevier Patient Education  2020 [employer].

## 2019-03-22 ENCOUNTER — Telehealth: Payer: Self-pay

## 2019-03-22 ENCOUNTER — Other Ambulatory Visit: Payer: Self-pay

## 2019-03-22 DIAGNOSIS — Z1211 Encounter for screening for malignant neoplasm of colon: Secondary | ICD-10-CM

## 2019-03-22 NOTE — Telephone Encounter (Signed)
Gastroenterology Pre-Procedure Review  Request Date: 04/10/19 Requesting Physician: Dr. Adolphus Birchwood  PATIENT REVIEW QUESTIONS: The patient responded to the following health history questions as indicated:    1. Are you having any GI issues? no 2. Do you have a personal history of Polyps? no 3. Do you have a family history of Colon Cancer or Polyps? no 4. Diabetes Mellitus? no 5. Joint replacements in the past 12 months?no 6. Major health problems in the past 3 months?no 7. Any artificial heart valves, MVP, or defibrillator?no    MEDICATIONS & ALLERGIES:    Patient reports the following regarding taking any anticoagulation/antiplatelet therapy:   Plavix, Coumadin, Eliquis, Xarelto, Lovenox, Pradaxa, Brilinta, or Effient? no Aspirin? no  Patient confirms/reports the following medications:  Current Outpatient Medications  Medication Sig Dispense Refill  . acetaminophen (TYLENOL) 650 MG CR tablet Take 650 mg by mouth every 8 (eight) hours as needed for pain.    Marland Kitchen ibuprofen (ADVIL,MOTRIN) 200 MG tablet Take 600 mg by mouth every 6 (six) hours as needed.     No current facility-administered medications for this visit.    Patient confirms/reports the following allergies:  Allergies  Allergen Reactions  . Aspirin Other (See Comments)    nosebleed  . Ciprofloxacin Swelling    Lip swelling    No orders of the defined types were placed in this encounter.   AUTHORIZATION INFORMATION Primary Insurance: 1D#: Group #:  Secondary Insurance: 1D#: Group #:  SCHEDULE INFORMATION: Date: 04/10/19 Time: Location:[facility]

## 2019-04-02 ENCOUNTER — Other Ambulatory Visit: Payer: Self-pay

## 2019-04-02 ENCOUNTER — Ambulatory Visit: Payer: BC Managed Care – PPO | Admitting: Family Medicine

## 2019-04-02 ENCOUNTER — Encounter: Payer: Self-pay | Admitting: Family Medicine

## 2019-04-02 ENCOUNTER — Telehealth: Payer: Self-pay | Admitting: Family Medicine

## 2019-04-02 VITALS — BP 120/80 | HR 68 | Temp 98.4°F | Ht 67.75 in | Wt 229.2 lb

## 2019-04-02 DIAGNOSIS — N3001 Acute cystitis with hematuria: Secondary | ICD-10-CM

## 2019-04-02 LAB — POC URINALSYSI DIPSTICK (AUTOMATED)
Bilirubin, UA: NEGATIVE
Glucose, UA: NEGATIVE
Ketones, UA: NEGATIVE
Nitrite, UA: NEGATIVE
Protein, UA: POSITIVE — AB
Spec Grav, UA: 1.01 (ref 1.010–1.025)
Urobilinogen, UA: 0.2 E.U./dL
pH, UA: 6 (ref 5.0–8.0)

## 2019-04-02 MED ORDER — CEPHALEXIN 500 MG PO CAPS: 500.0000 mg | ORAL_CAPSULE | Freq: Two times a day (BID) | ORAL | 0 refills | Status: DC

## 2019-04-02 NOTE — Telephone Encounter (Addendum)
Shouldn't cause any trouble. Take with food.

## 2019-04-02 NOTE — Telephone Encounter (Signed)
Left message on vm per dpr relaying Dr. G's message.  

## 2019-04-02 NOTE — Progress Notes (Addendum)
This visit was conducted in person.  BP 120/80 (BP Location: Left Arm, Patient Position: Sitting, Cuff Size: Large)   Pulse 68   Temp 98.4 F (36.9 C) (Temporal)   Ht 5' 7.75" (1.721 m)   Wt 229 lb 4 oz (104 kg)   SpO2 98%   BMI 35.12 kg/m    CC: blood in urine Subjective:    Patient ID: [name], female    DOB: 1965/08/10, 53 y.o.   MRN: [id number]  HPI: Jineen Magruder is a 53 y.o. female presenting on 04/02/2019 for Hematuria (C/o seeing blood in water after urinating and when wiping.  Noticed this morning.  Denies any other sxs.  Has been taking Aleve for minor aches/pains. )   This morning when she voided noticed gross blood in urine with possible small clot. Some increased urinary urgency noted as well. Denies abd pain, nausea, dysuria, frequency, fever, flank pain.   Has been taking more aleve regularly for aches - one a day for the last month.   LMP 2 yrs ago. H/o fibroids s/p myomectomy 2001 - but told fibroids had returned (small).   She did start jogging for goal weight loss.  Has been staying well hydrated.   Non smoker.  No known exposure to dyes.  No fmhx bladder or renal cancer.       Relevant past medical, surgical, family and social history reviewed and updated as indicated. Interim medical history since our last visit reviewed. Allergies and medications reviewed and updated. Outpatient Medications Prior to Visit  Medication Sig Dispense Refill  . acetaminophen (TYLENOL) 650 MG CR tablet Take 650 mg by mouth every 8 (eight) hours as needed for pain.    Marland Kitchen ibuprofen (ADVIL,MOTRIN) 200 MG tablet Take 600 mg by mouth every 6 (six) hours as needed.    . naproxen sodium (ALEVE) 220 MG tablet Take 220 mg by mouth as needed.     No facility-administered medications prior to visit.     Per HPI unless specifically indicated in ROS section below Review of Systems Objective:    BP 120/80 (BP Location: Left Arm, Patient Position: Sitting, Cuff Size: Large)    Pulse 68   Temp 98.4 F (36.9 C) (Temporal)   Ht 5' 7.75" (1.721 m)   Wt 229 lb 4 oz (104 kg)   SpO2 98%   BMI 35.12 kg/m   Wt Readings from Last 3 Encounters:  04/02/19 229 lb 4 oz (104 kg)  03/19/19 234 lb 4 oz (106.3 kg)  02/13/19 225 lb (102.1 kg)    Physical Exam Vitals and nursing note reviewed.  Constitutional:      Appearance: Normal appearance. She is obese. She is not ill-appearing.  Abdominal:     General: Abdomen is flat. Bowel sounds are normal. There is no distension.     Palpations: Abdomen is soft. There is no mass.     Tenderness: There is no abdominal tenderness. There is no right CVA tenderness, left CVA tenderness, guarding or rebound. Negative signs include Murphy's sign.     Hernia: No hernia is present.  Musculoskeletal:     Right lower leg: No edema.     Left lower leg: No edema.  Neurological:     Mental Status: She is alert.  Psychiatric:        Mood and Affect: Mood normal.        Behavior: Behavior normal.       Results for orders placed or performed in visit  on 04/02/19  POCT Urinalysis Dipstick (Automated)  Result Value Ref Range   Color, UA pink    Clarity, UA clear    Glucose, UA Negative Negative   Bilirubin, UA negative    Ketones, UA negative    Spec Grav, UA 1.010 1.010 - 1.025   Blood, UA 3+    pH, UA 6.0 5.0 - 8.0   Protein, UA Positive (A) Negative   Urobilinogen, UA 0.2 0.2 or 1.0 E.U./dL   Nitrite, UA negative    Leukocytes, UA Large (3+) (A) Negative   Assessment & Plan:  This visit occurred during the SARS-CoV-2 public health emergency.  Safety protocols were in place, including screening questions prior to the visit, additional usage of staff PPE, and extensive cleaning of exam room while observing appropriate contact time as indicated for disinfecting solutions.   Problem List Items Addressed This Visit    Acute hemorrhagic cystitis - Primary    UA suspicious for hemorrhagic UTI - treat with keflex 1 wk course. UCx  sent.  RTC 2-3 wks for lab visit for rpt UA to ensure blood in urine has resolved.       Relevant Orders   Urine culture   POCT Urinalysis Dipstick (Automated) (Completed)       Meds ordered this encounter  Medications  . cephALEXin (KEFLEX) 500 MG capsule    Sig: Take 1 capsule (500 mg total) by mouth 2 (two) times daily.    Dispense:  14 capsule    Refill:  0   Orders Placed This Encounter  Procedures  . Urine culture  . POCT Urinalysis Dipstick (Automated)    Patient instructions: I think blood in urine is coming from UTI.  Treat with keflex course x1 week.  Update if not improving with treatment.   Follow up plan: Return if symptoms worsen or fail to improve.  Ria Bush, MD

## 2019-04-02 NOTE — Telephone Encounter (Signed)
Pt wants to make sure the medication she was prescribed today will not impact her colonoscopy scheduled on 12/29. She is requesting a phone call if there is going to be an issue.

## 2019-04-02 NOTE — Assessment & Plan Note (Addendum)
UA suspicious for hemorrhagic UTI - treat with keflex 1 wk course. UCx sent.  RTC 2-3 wks for lab visit for rpt UA to ensure blood in urine has resolved.

## 2019-04-02 NOTE — Patient Instructions (Signed)
I think blood in urine is coming from UTI.  Treat with keflex course x1 week.  Update if not improving with treatment.   Urinary Tract Infection, Adult  A urinary tract infection (UTI) is an infection of any part of the urinary tract. The urinary tract includes the kidneys, ureters, bladder, and urethra. These organs make, store, and get rid of urine in the body. Your health care provider may use other names to describe the infection. An upper UTI affects the ureters and kidneys (pyelonephritis). A lower UTI affects the bladder (cystitis) and urethra (urethritis). What are the causes? Most urinary tract infections are caused by bacteria in your genital area, around the entrance to your urinary tract (urethra). These bacteria grow and cause inflammation of your urinary tract. What increases the risk? You are more likely to develop this condition if:  You have a urinary catheter that stays in place (indwelling).  You are not able to control when you urinate or have a bowel movement (you have incontinence).  You are female and you: ? Use a spermicide or diaphragm for birth control. ? Have low estrogen levels. ? Are pregnant.  You have certain genes that increase your risk (genetics).  You are sexually active.  You take antibiotic medicines.  You have a condition that causes your flow of urine to slow down, such as: ? An enlarged prostate, if you are female. ? Blockage in your urethra (stricture). ? A kidney stone. ? A nerve condition that affects your bladder control (neurogenic bladder). ? Not getting enough to drink, or not urinating often.  You have certain medical conditions, such as: ? Diabetes. ? A weak disease-fighting system (immunesystem). ? Sickle cell disease. ? Gout. ? Spinal cord injury. What are the signs or symptoms? Symptoms of this condition include:  Needing to urinate right away (urgently).  Frequent urination or passing small amounts of urine frequently.   Pain or burning with urination.  Blood in the urine.  Urine that smells bad or unusual.  Trouble urinating.  Cloudy urine.  Vaginal discharge, if you are female.  Pain in the abdomen or the lower back. You may also have:  Vomiting or a decreased appetite.  Confusion.  Irritability or tiredness.  A fever.  Diarrhea. The first symptom in older adults may be confusion. In some cases, they may not have any symptoms until the infection has worsened. How is this diagnosed? This condition is diagnosed based on your medical history and a physical exam. You may also have other tests, including:  Urine tests.  Blood tests.  Tests for sexually transmitted infections (STIs). If you have had more than one UTI, a cystoscopy or imaging studies may be done to determine the cause of the infections. How is this treated? Treatment for this condition includes:  Antibiotic medicine.  Over-the-counter medicines to treat discomfort.  Drinking enough water to stay hydrated. If you have frequent infections or have other conditions such as a kidney stone, you may need to see a health care provider who specializes in the urinary tract (urologist). In rare cases, urinary tract infections can cause sepsis. Sepsis is a life-threatening condition that occurs when the body responds to an infection. Sepsis is treated in the hospital with IV antibiotics, fluids, and other medicines. Follow these instructions at home:  Medicines  Take over-the-counter and prescription medicines only as told by your health care provider.  If you were prescribed an antibiotic medicine, take it as told by your health care provider.  Do not stop using the antibiotic even if you start to feel better. General instructions  Make sure you: ? Empty your bladder often and completely. Do not hold urine for long periods of time. ? Empty your bladder after sex. ? Wipe from front to back after a bowel movement if you are  female. Use each tissue one time when you wipe.  Drink enough fluid to keep your urine pale yellow.  Keep all follow-up visits as told by your health care provider. This is important. Contact a health care provider if:  Your symptoms do not get better after 1-2 days.  Your symptoms go away and then return. Get help right away if you have:  Severe pain in your back or your lower abdomen.  A fever.  Nausea or vomiting. Summary  A urinary tract infection (UTI) is an infection of any part of the urinary tract, which includes the kidneys, ureters, bladder, and urethra.  Most urinary tract infections are caused by bacteria in your genital area, around the entrance to your urinary tract (urethra).  Treatment for this condition often includes antibiotic medicines.  If you were prescribed an antibiotic medicine, take it as told by your health care provider. Do not stop using the antibiotic even if you start to feel better.  Keep all follow-up visits as told by your health care provider. This is important. This information is not intended to replace advice given to you by your health care provider. Make sure you discuss any questions you have with your health care provider. Document Released: 01/06/2005 Document Revised: 03/16/2018 Document Reviewed: 10/06/2017 Elsevier Patient Education  2020 [employer].

## 2019-04-04 LAB — URINE CULTURE
MICRO NUMBER:: 1217801
SPECIMEN QUALITY:: ADEQUATE

## 2019-04-05 ENCOUNTER — Other Ambulatory Visit
Admission: RE | Admit: 2019-04-05 | Discharge: 2019-04-05 | Disposition: A | Discharge status: Home / Self Care | Payer: BC Managed Care – PPO | Source: Ambulatory Visit | Attending: Gastroenterology | Admitting: Gastroenterology

## 2019-04-05 ENCOUNTER — Other Ambulatory Visit: Payer: Self-pay

## 2019-04-05 DIAGNOSIS — Z01812 Encounter for preprocedural laboratory examination: Secondary | ICD-10-CM | POA: Diagnosis not present

## 2019-04-05 DIAGNOSIS — Z20828 Contact with and (suspected) exposure to other viral communicable diseases: Secondary | ICD-10-CM | POA: Insufficient documentation

## 2019-04-05 LAB — SARS CORONAVIRUS 2 (TAT 6-24 HRS): SARS Coronavirus 2: NEGATIVE

## 2019-04-09 ENCOUNTER — Encounter: Payer: Self-pay | Admitting: Gastroenterology

## 2019-04-10 ENCOUNTER — Ambulatory Visit: Payer: BC Managed Care – PPO | Admitting: Anesthesiology

## 2019-04-10 ENCOUNTER — Encounter
Admission: RE | Disposition: A | Discharge status: Home / Self Care | Payer: Self-pay | Source: Home / Self Care | Attending: Gastroenterology

## 2019-04-10 ENCOUNTER — Ambulatory Visit
Admission: RE | Admit: 2019-04-10 | Discharge: 2019-04-10 | Disposition: A | Discharge status: Home / Self Care | Payer: BC Managed Care – PPO | Attending: Gastroenterology | Admitting: Gastroenterology

## 2019-04-10 ENCOUNTER — Encounter: Payer: Self-pay | Admitting: Gastroenterology

## 2019-04-10 DIAGNOSIS — Z823 Family history of stroke: Secondary | ICD-10-CM | POA: Insufficient documentation

## 2019-04-10 DIAGNOSIS — Z886 Allergy status to analgesic agent status: Secondary | ICD-10-CM | POA: Diagnosis not present

## 2019-04-10 DIAGNOSIS — Z8249 Family history of ischemic heart disease and other diseases of the circulatory system: Secondary | ICD-10-CM | POA: Insufficient documentation

## 2019-04-10 DIAGNOSIS — Z8041 Family history of malignant neoplasm of ovary: Secondary | ICD-10-CM | POA: Diagnosis not present

## 2019-04-10 DIAGNOSIS — Z6833 Body mass index (BMI) 33.0-33.9, adult: Secondary | ICD-10-CM | POA: Diagnosis not present

## 2019-04-10 DIAGNOSIS — Z825 Family history of asthma and other chronic lower respiratory diseases: Secondary | ICD-10-CM | POA: Diagnosis not present

## 2019-04-10 DIAGNOSIS — Z1211 Encounter for screening for malignant neoplasm of colon: Secondary | ICD-10-CM | POA: Diagnosis not present

## 2019-04-10 DIAGNOSIS — Z791 Long term (current) use of non-steroidal anti-inflammatories (NSAID): Secondary | ICD-10-CM | POA: Diagnosis not present

## 2019-04-10 DIAGNOSIS — E669 Obesity, unspecified: Secondary | ICD-10-CM | POA: Insufficient documentation

## 2019-04-10 DIAGNOSIS — K635 Polyp of colon: Secondary | ICD-10-CM

## 2019-04-10 DIAGNOSIS — Z833 Family history of diabetes mellitus: Secondary | ICD-10-CM | POA: Diagnosis not present

## 2019-04-10 DIAGNOSIS — Z7982 Long term (current) use of aspirin: Secondary | ICD-10-CM | POA: Diagnosis not present

## 2019-04-10 DIAGNOSIS — Z8619 Personal history of other infectious and parasitic diseases: Secondary | ICD-10-CM | POA: Insufficient documentation

## 2019-04-10 DIAGNOSIS — Z881 Allergy status to other antibiotic agents status: Secondary | ICD-10-CM | POA: Diagnosis not present

## 2019-04-10 DIAGNOSIS — Z8349 Family history of other endocrine, nutritional and metabolic diseases: Secondary | ICD-10-CM | POA: Insufficient documentation

## 2019-04-10 DIAGNOSIS — K5730 Diverticulosis of large intestine without perforation or abscess without bleeding: Secondary | ICD-10-CM | POA: Diagnosis not present

## 2019-04-10 DIAGNOSIS — D125 Benign neoplasm of sigmoid colon: Secondary | ICD-10-CM | POA: Diagnosis not present

## 2019-04-10 HISTORY — PX: COLONOSCOPY WITH PROPOFOL: SHX5780

## 2019-04-10 SURGERY — COLONOSCOPY WITH PROPOFOL
Anesthesia: General

## 2019-04-10 MED ORDER — PROPOFOL 10 MG/ML IV BOLUS
INTRAVENOUS | Status: DC | PRN
  Administered 2019-04-10: 70 mg via INTRAVENOUS

## 2019-04-10 MED ORDER — LIDOCAINE HCL (PF) 1 % IJ SOLN
INTRAMUSCULAR | Status: AC
  Filled 2019-04-10: qty 2

## 2019-04-10 MED ORDER — PROPOFOL 500 MG/50ML IV EMUL
INTRAVENOUS | Status: DC | PRN
  Administered 2019-04-10: 175 ug/kg/min via INTRAVENOUS

## 2019-04-10 MED ORDER — SODIUM CHLORIDE 0.9 % IV SOLN: INTRAVENOUS | Status: DC

## 2019-04-10 NOTE — H&P (Signed)
Vonda Antigua, MD [address], [place], [place], [place], [address] [address], [place], [place], [place], [address] Phone: [phone]  Fax: [phone]  Primary Care Physician:  Ria Bush, MD   Pre-Procedure History & Physical: HPI:  Breanna Weber is a 53 y.o. female is here for a colonoscopy.   Past Medical History:  Diagnosis Date  . COVID-19 virus detected 11/07/2018   outpatient swab, dx'd on 11/07/2018  . Fibroids   . History of chicken pox   . Uterine fibroid    removed    Past Surgical History:  Procedure Laterality Date  . CESAREAN SECTION  07/14/2002  . DIAGNOSTIC LAPAROSCOPY    . GANGLION CYST EXCISION  x2  . MYOMECTOMY  14-Jul-1999  . [place]  07/14/99  . UTERINE FIBROID SURGERY  07-13-00    Prior to Admission medications   Medication Sig Start Date End Date Taking? Authorizing Provider  cephALEXin (KEFLEX) 500 MG capsule Take 1 capsule (500 mg total) by mouth 2 (two) times daily. 04/02/19  Yes Ria Bush, MD  ibuprofen (ADVIL,MOTRIN) 200 MG tablet Take 600 mg by mouth every 6 (six) hours as needed.   Yes [provider]  naproxen sodium (ALEVE) 220 MG tablet Take 220 mg by mouth as needed.   Yes [provider]  acetaminophen (TYLENOL) 650 MG CR tablet Take 650 mg by mouth every 8 (eight) hours as needed for pain.    [provider]    Allergies as of 03/22/2019 - Review Complete 03/19/2019  Allergen Reaction Noted  . Aspirin Other (See Comments) 07/12/2011  . Ciprofloxacin Swelling 05/01/2014    Family History  Problem Relation Age of Onset  . Hypertension Mother   . Asthma Mother   . Hypertension Maternal Grandmother   . Cancer Maternal Grandmother        ovarian cancer  . Stroke Maternal Grandmother   . Coronary artery disease Maternal Grandmother 2063/07/14  . Thyroid disease Maternal Grandmother   . Diabetes Maternal Grandfather   . Hypertension Maternal Grandfather   . Thyroid disease Sister    . Thyroid disease Maternal Aunt     Social History   Socioeconomic History  . Marital status: Widowed    Spouse name: Not on file  . Number of children: Not on file  . Years of education: Not on file  . Highest education level: Not on file  Occupational History  . Not on file  Tobacco Use  . Smoking status: Never Smoker  . Smokeless tobacco: Never Used  Substance and Sexual Activity  . Alcohol use: No    Alcohol/week: 0.0 standard drinks    Comment: Occasional  . Drug use: No  . Sexual activity: Yes    Partners: Male    Birth control/protection: None  Other Topics Concern  . Not on file  Social History Narrative   Caffeine: none   Lives with son   Husband died 07/13/09 in [place], moved to be closer to family.   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 Difficulty of Paying Living Expenses: Not on file  Food Insecurity:   . Worried About Charity fundraiser in the Last Year: Not on file  . Ran Out of Food in  the Last Year: Not on file  Transportation Needs:   . Lack of Transportation (Medical): Not on file  . Lack of Transportation (Non-Medical): Not on file  Physical Activity:   . Days of Exercise per Week: Not on file  . Minutes of Exercise per Session: Not on file  Stress:   . Feeling of Stress : Not on file  Social Connections:   . Frequency of Communication with Friends and Family: Not on file  . Frequency of Social Gatherings with Friends and Family: Not on file  . Attends Religious Services: Not on file  . Active Member of Clubs or Organizations: Not on file  . Attends Archivist Meetings: Not on file  . Marital Status: Not on file  Intimate Partner Violence:   . Fear of Current or Ex-Partner: Not  on file  . Emotionally Abused: Not on file  . Physically Abused: Not on file  . Sexually Abused: Not on file    Review of Systems: See HPI, otherwise negative ROS  Physical Exam: BP 121/88   Pulse 65   Temp (!) 96.9 F (36.1 C) (Tympanic)   Resp 16   Ht 5\' 8"  (1.727 m)   Wt 101.2 kg   BMI 33.91 kg/m  General:   Alert,  pleasant and cooperative in NAD Head:  Normocephalic and atraumatic. Neck:  Supple; no masses or thyromegaly. Lungs:  Clear throughout to auscultation, normal respiratory effort.    Heart:  +S1, +S2, Regular rate and rhythm, No edema. Abdomen:  Soft, nontender and nondistended. Normal bowel sounds, without guarding, and without rebound.   Neurologic:  Alert and  oriented x4;  grossly normal neurologically.  Impression/Plan: Breanna Weber is here for a colonoscopy to be performed for average risk screening.  Risks, benefits, limitations, and alternatives regarding  colonoscopy have been reviewed with the patient.  Questions have been answered.  All parties agreeable.   Virgel Manifold, MD  04/10/2019, 9:44 AM

## 2019-04-10 NOTE — Transfer of Care (Signed)
Immediate Anesthesia Transfer of Care Note  Patient: Breanna Weber  Procedure(s) Performed: COLONOSCOPY WITH PROPOFOL (N/A )  Patient Location: PACU  Anesthesia Type:General  Level of Consciousness: sedated  Airway & Oxygen Therapy: Patient Spontanous Breathing and Patient connected to nasal cannula oxygen  Post-op Assessment: Report given to RN and Post -op Vital signs reviewed and stable  Post vital signs: Reviewed and stable  Last Vitals:  Vitals Value Taken Time  BP    Temp    Pulse 89 04/10/19 1023  Resp 18 04/10/19 1023  SpO2 97 % 04/10/19 1023  Vitals shown include unvalidated device data.  Last Pain:  Vitals:   04/10/19 0859  TempSrc: Tympanic         Complications: No apparent anesthesia complications

## 2019-04-10 NOTE — Anesthesia Post-op Follow-up Note (Signed)
Anesthesia QCDR form completed.        

## 2019-04-10 NOTE — Op Note (Signed)
[facility] Gastroenterology Patient Name: Breanna Weber Procedure Date: 04/10/2019 9:48 AM MRN: [id number] Account #: [id number] Date of Birth: [birth year] Admit Type: Outpatient Age: 53 Room: [facility] ENDO ROOM 3 Gender: Female Note Status: Finalized Procedure:             Colonoscopy Indications:           Screening for colorectal malignant neoplasm Providers:             Aujanae Mccullum B. Bonna Gains MD, MD Referring MD:          Ria Bush (Referring MD) Medicines:             Monitored Anesthesia Care Complications:         No immediate complications. Procedure:             Pre-Anesthesia Assessment:                        - ASA Grade Assessment: II - A patient with mild                         systemic disease.                        - Prior to the procedure, a History and Physical was                         performed, and patient medications, allergies and                         sensitivities were reviewed. The patient's tolerance                         of previous anesthesia was reviewed.                        - The risks and benefits of the procedure and the                         sedation options and risks were discussed with the                         patient. All questions were answered and informed                         consent was obtained.                        - Patient identification and proposed procedure were                         verified prior to the procedure by the physician, the                         nurse, the anesthesiologist, the anesthetist and the                         technician. The procedure was verified in the                         procedure room.  After obtaining informed consent, the colonoscope was                         passed under direct vision. Throughout the procedure,                         the patient's blood pressure, pulse, and oxygen                         saturations were  monitored continuously. The                         Colonoscope was introduced through the anus and                         advanced to the the cecum, identified by appendiceal                         orifice and ileocecal valve. The colonoscopy was                         performed with ease. The patient tolerated the                         procedure well. The quality of the bowel preparation                         was good. Findings:      The perianal and digital rectal examinations were normal.      A 4 mm polyp was found in the sigmoid colon. The polyp was sessile. The       polyp was removed with a cold biopsy forceps. Resection and retrieval       were complete.      A few diverticula were found in the sigmoid colon.      The exam was otherwise without abnormality.      The rectum, sigmoid colon, descending colon, transverse colon, ascending       colon and cecum appeared normal.      The retroflexed view of the distal rectum and anal verge was normal and       showed no anal or rectal abnormalities. Impression:            - One 4 mm polyp in the sigmoid colon, removed with a                         cold biopsy forceps. Resected and retrieved.                        - Diverticulosis in the sigmoid colon.                        - The examination was otherwise normal.                        - The rectum, sigmoid colon, descending colon,                         transverse colon, ascending colon and cecum are normal.                        -  The distal rectum and anal verge are normal on                         retroflexion view. Recommendation:        - Discharge patient to home (with escort).                        - High fiber diet.                        - Advance diet as tolerated.                        - Continue present medications.                        - Await pathology results.                        - Repeat colonoscopy in 5 years if polyps show                          adenoma, 10 years if they are hyperplastic.                        - The findings and recommendations were discussed with                         the patient.                        - The findings and recommendations were discussed with                         the patient's family.                        - Return to primary care physician as previously                         scheduled. Procedure Code(s):     --- Professional ---                        [phone], Colonoscopy, flexible; with biopsy, single or                         multiple Diagnosis Code(s):     --- Professional ---                        Z12.11, Encounter for screening for malignant neoplasm                         of colon                        K63.5, Polyp of colon CPT copyright 2019 American Medical Association. All rights reserved. The codes documented in this report are preliminary and upon coder review may  be revised to meet current compliance requirements.  Vonda Antigua, MD Margretta Sidle B. Bonna Gains MD, MD 04/10/2019 10:25:17 AM This report has been signed electronically. Number of  Addenda: 0 Note Initiated On: 04/10/2019 9:48 AM Scope Withdrawal Time: 0 hours 14 minutes 6 seconds  Total Procedure Duration: 0 hours 19 minutes 24 seconds  Estimated Blood Loss:  Estimated blood loss: none.      [facility], [place]

## 2019-04-10 NOTE — Anesthesia Procedure Notes (Signed)
Date/Time: 04/10/2019 10:02 AM Performed by: Nelda Marseille, CRNA Pre-anesthesia Checklist: Patient identified, Emergency Drugs available, Suction available, Patient being monitored and Timeout performed Oxygen Delivery Method: Nasal cannula

## 2019-04-10 NOTE — Anesthesia Postprocedure Evaluation (Signed)
Anesthesia Post Note  Patient: Breanna Weber  Procedure(s) Performed: COLONOSCOPY WITH PROPOFOL (N/A )  Patient location during evaluation: Endoscopy Anesthesia Type: General Level of consciousness: awake and alert Pain management: pain level controlled Vital Signs Assessment: post-procedure vital signs reviewed and stable Respiratory status: spontaneous breathing, nonlabored ventilation, respiratory function stable and patient connected to nasal cannula oxygen Cardiovascular status: blood pressure returned to baseline and stable Postop Assessment: no apparent nausea or vomiting Anesthetic complications: no     Last Vitals:  Vitals:   04/10/19 1042 04/10/19 1052  BP: 112/88 (!) 128/98  Pulse:    Resp:    Temp:      Last Pain:  Vitals:   04/10/19 1042  TempSrc:   PainSc: 0-No pain                 Martha Clan

## 2019-04-10 NOTE — Anesthesia Preprocedure Evaluation (Signed)
Anesthesia Evaluation  Patient identified by MRN, date of birth, ID band Patient awake    Reviewed: Allergy & Precautions, H&P , NPO status , Patient's Chart, lab work & pertinent test results, reviewed documented beta blocker date and time   History of Anesthesia Complications Negative for: history of anesthetic complications  Airway Mallampati: II  TM Distance: >3 FB Neck ROM: full    Dental no notable dental hx. (+) Dental Advidsory Given   Pulmonary neg pulmonary ROS,    Pulmonary exam normal        Cardiovascular Exercise Tolerance: Good negative cardio ROS Normal cardiovascular exam     Neuro/Psych negative neurological ROS  negative psych ROS   GI/Hepatic negative GI ROS, Neg liver ROS,   Endo/Other  negative endocrine ROS  Renal/GU negative Renal ROS  negative genitourinary   Musculoskeletal   Abdominal   Peds  Hematology negative hematology ROS (+)   Anesthesia Other Findings Past Medical History: 11/07/2018: COVID-19 virus detected     Comment:  outpatient swab, dx'd on 11/07/2018 No date: Fibroids No date: History of chicken pox No date: Uterine fibroid     Comment:  removed  Obesity  Reproductive/Obstetrics negative OB ROS                             Anesthesia Physical Anesthesia Plan  ASA: II  Anesthesia Plan: General   Post-op Pain Management:    Induction: Intravenous  PONV Risk Score and Plan: 3 and Propofol infusion and TIVA  Airway Management Planned: Natural Airway and Nasal Cannula  Additional Equipment:   Intra-op Plan:   Post-operative Plan:   Informed Consent: I have reviewed the patients History and Physical, chart, labs and discussed the procedure including the risks, benefits and alternatives for the proposed anesthesia with the patient or authorized representative who has indicated his/her understanding and acceptance.     Dental  Advisory Given  Plan Discussed with: Anesthesiologist, CRNA and Surgeon  Anesthesia Plan Comments:         Anesthesia Quick Evaluation

## 2019-04-11 ENCOUNTER — Telehealth: Payer: Self-pay

## 2019-04-11 ENCOUNTER — Encounter: Payer: Self-pay | Admitting: *Deleted

## 2019-04-11 LAB — SURGICAL PATHOLOGY

## 2019-04-11 NOTE — Telephone Encounter (Signed)
LVM to call clinic, pt needs OCIVD screen and back lab info 12.30.2020 TLJ

## 2019-04-15 ENCOUNTER — Other Ambulatory Visit: Payer: Self-pay | Admitting: Family Medicine

## 2019-04-15 DIAGNOSIS — N3001 Acute cystitis with hematuria: Secondary | ICD-10-CM

## 2019-04-16 ENCOUNTER — Other Ambulatory Visit: Payer: Self-pay

## 2019-04-16 ENCOUNTER — Other Ambulatory Visit (INDEPENDENT_AMBULATORY_CARE_PROVIDER_SITE_OTHER): Payer: Self-pay

## 2019-04-16 ENCOUNTER — Encounter: Payer: Self-pay | Admitting: Gastroenterology

## 2019-04-16 DIAGNOSIS — N3001 Acute cystitis with hematuria: Secondary | ICD-10-CM

## 2019-04-16 LAB — URINALYSIS, ROUTINE W REFLEX MICROSCOPIC
Bilirubin Urine: NEGATIVE
Hgb urine dipstick: NEGATIVE
Ketones, ur: NEGATIVE
Nitrite: NEGATIVE
RBC / HPF: NONE SEEN (ref 0–?)
Specific Gravity, Urine: 1.025 (ref 1.000–1.030)
Total Protein, Urine: NEGATIVE
Urine Glucose: NEGATIVE
Urobilinogen, UA: 0.2 (ref 0.0–1.0)
pH: 5.5 (ref 5.0–8.0)

## 2019-04-24 ENCOUNTER — Encounter: Payer: Self-pay | Admitting: Family Medicine

## 2019-04-24 ENCOUNTER — Other Ambulatory Visit: Payer: Self-pay | Admitting: Family Medicine

## 2019-04-24 ENCOUNTER — Ambulatory Visit: Payer: BC Managed Care – PPO | Admitting: Obstetrics and Gynecology

## 2019-04-24 NOTE — Telephone Encounter (Signed)
Spoke with pt asking if she's still having sxs.  Says sxs cleared up after finishing the abx.  But now she is having cloudy urine again and urine has a different odor.  Denies seeing any blood or having any other sxs.

## 2019-04-25 ENCOUNTER — Encounter: Payer: Self-pay | Admitting: Family Medicine

## 2019-04-25 ENCOUNTER — Other Ambulatory Visit: Payer: Self-pay | Admitting: Family Medicine

## 2019-04-25 NOTE — Telephone Encounter (Signed)
Please have her submit urine specimen for UA with reflex to micro and culture if abnormal. Would want to await urine results prior to treating.

## 2019-04-25 NOTE — Telephone Encounter (Signed)
Pt scheduled for lab visit.  [See Refill Encounter, 04/24/19.]

## 2019-04-25 NOTE — Telephone Encounter (Signed)
Spoke with pt relaying Dr. Synthia Innocent message.   Verbalizes understanding.  Lab visit scheduled tomorrow at 8:35.

## 2019-04-26 ENCOUNTER — Other Ambulatory Visit: Payer: Self-pay

## 2019-04-26 ENCOUNTER — Other Ambulatory Visit (INDEPENDENT_AMBULATORY_CARE_PROVIDER_SITE_OTHER): Payer: Self-pay

## 2019-04-26 DIAGNOSIS — N3001 Acute cystitis with hematuria: Secondary | ICD-10-CM

## 2019-04-27 ENCOUNTER — Encounter: Payer: Self-pay | Admitting: Family Medicine

## 2019-04-27 MED ORDER — SULFAMETHOXAZOLE-TRIMETHOPRIM 800-160 MG PO TABS: 1.0000 | ORAL_TABLET | Freq: Two times a day (BID) | ORAL | 0 refills | Status: DC

## 2019-04-28 LAB — URINE CULTURE
MICRO NUMBER:: 10045271
SPECIMEN QUALITY:: ADEQUATE

## 2019-04-28 LAB — URINALYSIS W MICROSCOPIC + REFLEX CULTURE
Bilirubin Urine: NEGATIVE
Glucose, UA: NEGATIVE
Hgb urine dipstick: NEGATIVE
Hyaline Cast: NONE SEEN /LPF
Ketones, ur: NEGATIVE
Nitrites, Initial: NEGATIVE
Protein, ur: NEGATIVE
RBC / HPF: NONE SEEN /HPF (ref 0–2)
Specific Gravity, Urine: 1.01 (ref 1.001–1.03)
pH: 6 (ref 5.0–8.0)

## 2019-04-28 LAB — CULTURE INDICATED

## 2019-05-09 ENCOUNTER — Ambulatory Visit (INDEPENDENT_AMBULATORY_CARE_PROVIDER_SITE_OTHER): Payer: 59 | Admitting: Family Medicine

## 2019-05-09 ENCOUNTER — Other Ambulatory Visit: Payer: Self-pay

## 2019-05-09 ENCOUNTER — Other Ambulatory Visit: Payer: 59

## 2019-05-09 ENCOUNTER — Encounter: Payer: Self-pay | Admitting: Family Medicine

## 2019-05-09 VITALS — BP 126/84 | HR 60 | Ht 68.0 in | Wt 224.0 lb

## 2019-05-09 DIAGNOSIS — Z124 Encounter for screening for malignant neoplasm of cervix: Secondary | ICD-10-CM

## 2019-05-09 DIAGNOSIS — R7989 Other specified abnormal findings of blood chemistry: Secondary | ICD-10-CM | POA: Diagnosis not present

## 2019-05-09 DIAGNOSIS — N390 Urinary tract infection, site not specified: Secondary | ICD-10-CM

## 2019-05-09 DIAGNOSIS — Z1151 Encounter for screening for human papillomavirus (HPV): Secondary | ICD-10-CM

## 2019-05-09 DIAGNOSIS — Z01419 Encounter for gynecological examination (general) (routine) without abnormal findings: Secondary | ICD-10-CM

## 2019-05-09 NOTE — Progress Notes (Signed)
   GYNECOLOGY ANNUAL PREVENTATIVE CARE ENCOUNTER NOTE  Subjective:   Breanna Weber is a 54 y.o. G26P0021 female here for a routine annual gynecologic exam.  Current complaints: recurrent UTI, triggered by sex., has had 20 this years and took abx which help. Reports changing condom type to see if this would help. Reports use of cranberry supplement which has not helped entirely prevent infections.   Denies abnormal vaginal bleeding, discharge, pelvic pain, problems with intercourse or other gynecologic concerns.    Gynecologic History No LMP recorded. Patient is postmenopausal. Contraception: post menopausal status Last Pap: WNL- counseled on safety to wait for screening and declines. Would like pap today.Last mammogram: 2017. Results were: normal  The following portions of the patient's history were reviewed and updated as appropriate: allergies, current medications, past family history, past medical history, past social history, past surgical history and problem list.  Review of Systems Pertinent items are noted in HPI.   Objective:  BP 126/84   Pulse 60   Ht 5\' 8"  (1.727 m)   Wt 224 lb (101.6 kg)   BMI 34.06 kg/m  CONSTITUTIONAL: Well-developed, well-nourished female in no acute distress.  HENT:  Normocephalic, atraumatic, External right and left ear normal. Oropharynx is clear and moist EYES:  No scleral icterus.  NECK: Normal range of motion, supple, no masses.  Normal thyroid.  SKIN: Skin is warm and dry. No rash noted. Not diaphoretic. No erythema. No pallor. NEUROLOGIC: Alert and oriented to person, place, and time. Normal reflexes, muscle tone coordination. No cranial nerve deficit noted. PSYCHIATRIC: Normal mood and affect. Normal behavior. Normal judgment and thought content. CARDIOVASCULAR: Normal heart rate noted, regular rhythm. 2+ distal pulses. RESPIRATORY: Effort and breath sounds normal, no problems with respiration noted. BREASTS: Symmetric in size. No masses, skin  changes, nipple drainage, or lymphadenopathy. ABDOMEN: Soft,  no distention noted.  No tenderness, rebound or guarding.  PELVIC: Normal appearing external genitalia; normal appearing vaginal mucosa and cervix.  No abnormal discharge noted.  Pap smear obtained.  Normal uterine size, no other palpable masses, no uterine or adnexal tenderness. MUSCULOSKELETAL: Normal range of motion.      Assessment and Plan:  1) Annual gynecologic examination with pap smear:  Will follow up results of pap smear and manage accordingly.  Routine preventative health maintenance measures emphasized.  1. Well woman exam with routine gynecological exam - Cytology - PAP - MM 3D SCREEN BREAST BILATERAL; Future - VITAMIN D 25 Hydroxy (Vit-D Deficiency, Fractures)  2. Recurrent UTI Reviewed options for postcoital treatment  Reviewed option for estrace cream treatment Discussed voiding after sex and continue cranberry Patient will think about possible estrace cream for prevention  Please refer to After Visit Summary for other counseling recommendations.   No follow-ups on file.  Caren Macadam, MD, MPH, ABFM Attending Physician Center for [facility]

## 2019-05-09 NOTE — Progress Notes (Signed)
Has issues with recurrent UTI's after intercourse

## 2019-05-10 LAB — VITAMIN D 25 HYDROXY (VIT D DEFICIENCY, FRACTURES): Vit D, 25-Hydroxy: 10.5 ng/mL — ABNORMAL LOW (ref 30.0–100.0)

## 2019-05-10 MED ORDER — VITAMIN D (ERGOCALCIFEROL) 1.25 MG (50000 UNIT) PO CAPS: 50000.0000 [IU] | ORAL_CAPSULE | ORAL | 0 refills | Status: AC

## 2019-05-10 NOTE — Addendum Note (Signed)
Addended by: Caryl Bis on: 05/10/2019 01:01 PM   Modules accepted: Orders

## 2019-05-14 LAB — CYTOLOGY - PAP
Adequacy: ABSENT
Comment: NEGATIVE
Diagnosis: NEGATIVE
HPV 16: NEGATIVE
HPV 18 / 45: NEGATIVE
High risk HPV: POSITIVE — AB

## 2019-05-29 DIAGNOSIS — N390 Urinary tract infection, site not specified: Secondary | ICD-10-CM

## 2019-06-04 MED ORDER — SULFAMETHOXAZOLE-TRIMETHOPRIM 800-160 MG PO TABS: 1.0000 | ORAL_TABLET | Freq: Once | ORAL | 3 refills | Status: AC

## 2019-07-02 ENCOUNTER — Other Ambulatory Visit: Payer: Self-pay | Admitting: Family Medicine

## 2019-07-02 DIAGNOSIS — R7989 Other specified abnormal findings of blood chemistry: Secondary | ICD-10-CM

## 2019-07-05 ENCOUNTER — Ambulatory Visit: Payer: 59 | Attending: Internal Medicine

## 2019-07-05 DIAGNOSIS — Z23 Encounter for immunization: Secondary | ICD-10-CM

## 2019-07-05 NOTE — Progress Notes (Signed)
   Covid-19 Vaccination Clinic  Name:  Breanna Weber    MRN: [id number] DOB: Feb 11, 1966  07/05/2019  [name] was observed post Covid-19 immunization for 15 minutes without incident. She was provided with Vaccine Information Sheet and instruction to access the V-Safe system.   [name] was instructed to call 911 with any severe reactions post vaccine: Marland Kitchen Difficulty breathing  . Swelling of face and throat  . A fast heartbeat  . A bad rash all over body  . Dizziness and weakness   Immunizations Administered    Name Date Dose VIS Date Route   Pfizer COVID-19 Vaccine 07/05/2019 12:45 PM 0.3 mL 03/23/2019 Intramuscular   Manufacturer: [place]   Lot: [id number]   [place]: [id number]

## 2019-07-16 ENCOUNTER — Ambulatory Visit: Payer: 59

## 2019-07-30 ENCOUNTER — Ambulatory Visit: Payer: 59 | Attending: Internal Medicine

## 2019-07-30 ENCOUNTER — Other Ambulatory Visit (INDEPENDENT_AMBULATORY_CARE_PROVIDER_SITE_OTHER): Payer: 59

## 2019-07-30 DIAGNOSIS — E8809 Other disorders of plasma-protein metabolism, not elsewhere classified: Secondary | ICD-10-CM | POA: Diagnosis not present

## 2019-07-30 DIAGNOSIS — R700 Elevated erythrocyte sedimentation rate: Secondary | ICD-10-CM

## 2019-07-30 DIAGNOSIS — Z23 Encounter for immunization: Secondary | ICD-10-CM

## 2019-07-30 DIAGNOSIS — R779 Abnormality of plasma protein, unspecified: Secondary | ICD-10-CM

## 2019-07-30 LAB — SEDIMENTATION RATE: Sed Rate: 43 mm/hr — ABNORMAL HIGH (ref 0–30)

## 2019-07-30 LAB — CBC WITH DIFFERENTIAL/PLATELET
Basophils Absolute: 0 10*3/uL (ref 0.0–0.1)
Basophils Relative: 0.9 % (ref 0.0–3.0)
Eosinophils Absolute: 0.1 10*3/uL (ref 0.0–0.7)
Eosinophils Relative: 1.4 % (ref 0.0–5.0)
HCT: 36.3 % (ref 36.0–46.0)
Hemoglobin: 12 g/dL (ref 12.0–15.0)
Lymphocytes Relative: 35.4 % (ref 12.0–46.0)
Lymphs Abs: 1.4 10*3/uL (ref 0.7–4.0)
MCHC: 32.9 g/dL (ref 30.0–36.0)
MCV: 91.1 fl (ref 78.0–100.0)
Monocytes Absolute: 0.3 10*3/uL (ref 0.1–1.0)
Monocytes Relative: 8.2 % (ref 3.0–12.0)
Neutro Abs: 2.2 10*3/uL (ref 1.4–7.7)
Neutrophils Relative %: 54.1 % (ref 43.0–77.0)
Platelets: 238 10*3/uL (ref 150.0–400.0)
RBC: 3.98 Mil/uL (ref 3.87–5.11)
RDW: 13.4 % (ref 11.5–15.5)
WBC: 4 10*3/uL (ref 4.0–10.5)

## 2019-07-30 LAB — HEPATIC FUNCTION PANEL
ALT: 8 U/L (ref 0–35)
AST: 15 U/L (ref 0–37)
Albumin: 4.1 g/dL (ref 3.5–5.2)
Alkaline Phosphatase: 90 U/L (ref 39–117)
Bilirubin, Direct: 0 mg/dL (ref 0.0–0.3)
Total Bilirubin: 0.4 mg/dL (ref 0.2–1.2)
Total Protein: 8 g/dL (ref 6.0–8.3)

## 2019-07-30 LAB — HIGH SENSITIVITY CRP: CRP, High Sensitivity: 2.11 mg/L (ref 0.000–5.000)

## 2019-07-30 NOTE — Progress Notes (Signed)
   Covid-19 Vaccination Clinic  Name:  Breanna Weber    MRN: [id number] DOB: 1965/05/01  07/30/2019  Breanna Weber was observed post Covid-19 immunization for 15 minutes without incident. She was provided with Vaccine Information Sheet and instruction to access the V-Safe system.   Breanna Weber was instructed to call 911 with any severe reactions post vaccine: Marland Kitchen Difficulty breathing  . Swelling of face and throat  . A fast heartbeat  . A bad rash all over body  . Dizziness and weakness   Immunizations Administered    Name Date Dose VIS Date Route   Pfizer COVID-19 Vaccine 07/30/2019 11:10 AM 0.3 mL 06/06/2018 Intramuscular   Manufacturer: [place]   Lot: [id number]   [place]: [id number]

## 2019-08-02 ENCOUNTER — Encounter: Payer: Self-pay | Admitting: Family Medicine

## 2020-03-15 ENCOUNTER — Other Ambulatory Visit: Payer: Self-pay | Admitting: Family Medicine

## 2020-03-15 DIAGNOSIS — E785 Hyperlipidemia, unspecified: Secondary | ICD-10-CM

## 2020-03-15 DIAGNOSIS — E559 Vitamin D deficiency, unspecified: Secondary | ICD-10-CM | POA: Insufficient documentation

## 2020-03-18 ENCOUNTER — Other Ambulatory Visit: Payer: BC Managed Care – PPO

## 2020-03-19 ENCOUNTER — Other Ambulatory Visit (INDEPENDENT_AMBULATORY_CARE_PROVIDER_SITE_OTHER): Payer: 59

## 2020-03-19 ENCOUNTER — Other Ambulatory Visit: Payer: Self-pay

## 2020-03-19 DIAGNOSIS — E785 Hyperlipidemia, unspecified: Secondary | ICD-10-CM

## 2020-03-19 DIAGNOSIS — E559 Vitamin D deficiency, unspecified: Secondary | ICD-10-CM

## 2020-03-19 LAB — LIPID PANEL
Cholesterol: 185 mg/dL (ref 0–200)
HDL: 61.5 mg/dL (ref >39.00)
LDL Cholesterol: 110 mg/dL — ABNORMAL HIGH (ref 0–99)
NonHDL: 123.35
Total CHOL/HDL Ratio: 3
Triglycerides: 68 mg/dL (ref 0.0–149.0)
VLDL: 13.6 mg/dL (ref 0.0–40.0)

## 2020-03-19 LAB — COMPREHENSIVE METABOLIC PANEL
ALT: 7 U/L (ref 0–35)
AST: 14 U/L (ref 0–37)
Albumin: 4.1 g/dL (ref 3.5–5.2)
Alkaline Phosphatase: 88 U/L (ref 39–117)
BUN: 16 mg/dL (ref 6–23)
CO2: 29 mEq/L (ref 19–32)
Calcium: 9.5 mg/dL (ref 8.4–10.5)
Chloride: 102 mEq/L (ref 96–112)
Creatinine, Ser: 0.8 mg/dL (ref 0.40–1.20)
GFR: 82.15 mL/min (ref >60.00)
Glucose, Bld: 78 mg/dL (ref 70–99)
Potassium: 4.1 mEq/L (ref 3.5–5.1)
Sodium: 137 mEq/L (ref 135–145)
Total Bilirubin: 0.5 mg/dL (ref 0.2–1.2)
Total Protein: 8.5 g/dL — ABNORMAL HIGH (ref 6.0–8.3)

## 2020-03-19 LAB — VITAMIN D 25 HYDROXY (VIT D DEFICIENCY, FRACTURES): VITD: 13.39 ng/mL — ABNORMAL LOW (ref 30.00–100.00)

## 2020-03-21 ENCOUNTER — Other Ambulatory Visit: Payer: Self-pay

## 2020-03-21 ENCOUNTER — Ambulatory Visit: Payer: 59 | Admitting: Family Medicine

## 2020-03-21 ENCOUNTER — Encounter: Payer: Self-pay | Admitting: Family Medicine

## 2020-03-21 VITALS — BP 120/78 | HR 66 | Temp 97.5°F | Ht 68.0 in | Wt 212.1 lb

## 2020-03-21 DIAGNOSIS — Z23 Encounter for immunization: Secondary | ICD-10-CM

## 2020-03-21 DIAGNOSIS — E785 Hyperlipidemia, unspecified: Secondary | ICD-10-CM

## 2020-03-21 DIAGNOSIS — E669 Obesity, unspecified: Secondary | ICD-10-CM | POA: Diagnosis not present

## 2020-03-21 DIAGNOSIS — E559 Vitamin D deficiency, unspecified: Secondary | ICD-10-CM

## 2020-03-21 DIAGNOSIS — L308 Other specified dermatitis: Secondary | ICD-10-CM

## 2020-03-21 DIAGNOSIS — Z0000 Encounter for general adult medical examination without abnormal findings: Secondary | ICD-10-CM | POA: Diagnosis not present

## 2020-03-21 DIAGNOSIS — R779 Abnormality of plasma protein, unspecified: Secondary | ICD-10-CM | POA: Insufficient documentation

## 2020-03-21 DIAGNOSIS — L309 Dermatitis, unspecified: Secondary | ICD-10-CM | POA: Insufficient documentation

## 2020-03-21 MED ORDER — VITAMIN D3 1.25 MG (50000 UT) PO TABS: 1.0000 | ORAL_TABLET | ORAL | 1 refills | Status: DC

## 2020-03-21 MED ORDER — TRIAMCINOLONE ACETONIDE 0.1 % EX CREA: 1.0000 "application " | TOPICAL_CREAM | Freq: Two times a day (BID) | CUTANEOUS | 1 refills | Status: DC

## 2020-03-21 NOTE — Assessment & Plan Note (Signed)
Chronic, stable off meds. The 10-year ASCVD risk score Mikey Bussing DC Brooke Bonito., et al., 2013) is: 1.6%   Values used to calculate the score:     Age: 54 years     Sex: Female     Is Non-Hispanic African American: No     Diabetic: No     Tobacco smoker: No     Systolic Blood Pressure: 742 mmHg     Is BP treated: No     HDL Cholesterol: 61.5 mg/dL     Total Cholesterol: 185 mg/dL

## 2020-03-21 NOTE — Assessment & Plan Note (Signed)
Restart vit D 50k weekly, recheck in 6 months.

## 2020-03-21 NOTE — Assessment & Plan Note (Signed)
Mildly elevated - recheck at 6 mo labs.

## 2020-03-21 NOTE — Assessment & Plan Note (Signed)
Preventative protocols reviewed and updated unless pt declined. Discussed healthy diet and lifestyle.  

## 2020-03-21 NOTE — Assessment & Plan Note (Addendum)
12lb weight loss noted over the past year - attributes to walking and healthier diet. Continue to encourage healthy diet and lifestyle choices.

## 2020-03-21 NOTE — Assessment & Plan Note (Signed)
R posterior shoulder - Rx triamcinolone cream with steroid precautions.

## 2020-03-21 NOTE — Patient Instructions (Addendum)
Flu shot today  Restart vitamin D 50,000 units weekly sent to pharmacy  For eczema - try triamcinolone cream sent to pharmacy.  You are doing well today  Return as needed or in 6 months for lab visit only then in 1 year for next physical.   Health Maintenance for Postmenopausal Women Menopause is a normal process in which your ability to get pregnant comes to an end. This process happens slowly over many months or years, usually between the ages of 56 and 48. Menopause is complete when you have missed your menstrual periods for 12 months. It is important to talk with your health care provider about some of the most common conditions that affect women after menopause (postmenopausal women). These include heart disease, cancer, and bone loss (osteoporosis). Adopting a healthy lifestyle and getting preventive care can help to promote your health and wellness. The actions you take can also lower your chances of developing some of these common conditions. What should I know about menopause? During menopause, you may get a number of symptoms, such as:  Hot flashes. These can be moderate or severe.  Night sweats.  Decrease in sex drive.  Mood swings.  Headaches.  Tiredness.  Irritability.  Memory problems.  Insomnia. Choosing to treat or not to treat these symptoms is a decision that you make with your health care provider. Do I need hormone replacement therapy?  Hormone replacement therapy is effective in treating symptoms that are caused by menopause, such as hot flashes and night sweats.  Hormone replacement carries certain risks, especially as you become older. If you are thinking about using estrogen or estrogen with progestin, discuss the benefits and risks with your health care provider. What is my risk for heart disease and stroke? The risk of heart disease, heart attack, and stroke increases as you age. One of the causes may be a change in the body's hormones during menopause.  This can affect how your body uses dietary fats, triglycerides, and cholesterol. Heart attack and stroke are medical emergencies. There are many things that you can do to help prevent heart disease and stroke. Watch your blood pressure  High blood pressure causes heart disease and increases the risk of stroke. This is more likely to develop in people who have high blood pressure readings, are of African descent, or are overweight.  Have your blood pressure checked: ? Every 3-5 years if you are 61-76 years of age. ? Every year if you are 34 years old or older. Eat a healthy diet   Eat a diet that includes plenty of vegetables, fruits, low-fat dairy products, and lean protein.  Do not eat a lot of foods that are high in solid fats, added sugars, or sodium. Get regular exercise Get regular exercise. This is one of the most important things you can do for your health. Most adults should:  Try to exercise for at least 150 minutes each week. The exercise should increase your heart rate and make you sweat (moderate-intensity exercise).  Try to do strengthening exercises at least twice each week. Do these in addition to the moderate-intensity exercise.  Spend less time sitting. Even light physical activity can be beneficial. Other tips  Work with your health care provider to achieve or maintain a healthy weight.  Do not use any products that contain nicotine or tobacco, such as cigarettes, e-cigarettes, and chewing tobacco. If you need help quitting, ask your health care provider.  Know your numbers. Ask your health care provider  to check your cholesterol and your blood sugar (glucose). Continue to have your blood tested as directed by your health care provider. Do I need screening for cancer? Depending on your health history and family history, you may need to have cancer screening at different stages of your life. This may include screening for:  Breast cancer.  Cervical cancer.  Lung  cancer.  Colorectal cancer. What is my risk for osteoporosis? After menopause, you may be at increased risk for osteoporosis. Osteoporosis is a condition in which bone destruction happens more quickly than new bone creation. To help prevent osteoporosis or the bone fractures that can happen because of osteoporosis, you may take the following actions:  If you are 57-67 years old, get at least 1,000 mg of calcium and at least 600 mg of vitamin D per day.  If you are older than age 54 but younger than age 75, get at least 1,200 mg of calcium and at least 600 mg of vitamin D per day.  If you are older than age 61, get at least 1,200 mg of calcium and at least 800 mg of vitamin D per day. Smoking and drinking excessive alcohol increase the risk of osteoporosis. Eat foods that are rich in calcium and vitamin D, and do weight-bearing exercises several times each week as directed by your health care provider. How does menopause affect my mental health? Depression may occur at any age, but it is more common as you become older. Common symptoms of depression include:  Low or sad mood.  Changes in sleep patterns.  Changes in appetite or eating patterns.  Feeling an overall lack of motivation or enjoyment of activities that you previously enjoyed.  Frequent crying spells. Talk with your health care provider if you think that you are experiencing depression. General instructions See your health care provider for regular wellness exams and vaccines. This may include:  Scheduling regular health, dental, and eye exams.  Getting and maintaining your vaccines. These include: ? Influenza vaccine. Get this vaccine each year before the flu season begins. ? Pneumonia vaccine. ? Shingles vaccine. ? Tetanus, diphtheria, and pertussis (Tdap) booster vaccine. Your health care provider may also recommend other immunizations. Tell your health care provider if you have ever been abused or do not feel safe at  home. Summary  Menopause is a normal process in which your ability to get pregnant comes to an end.  This condition causes hot flashes, night sweats, decreased interest in sex, mood swings, headaches, or lack of sleep.  Treatment for this condition may include hormone replacement therapy.  Take actions to keep yourself healthy, including exercising regularly, eating a healthy diet, watching your weight, and checking your blood pressure and blood sugar levels.  Get screened for cancer and depression. Make sure that you are up to date with all your vaccines. This information is not intended to replace advice given to you by your health care provider. Make sure you discuss any questions you have with your health care provider. Document Revised: 03/22/2018 Document Reviewed: 03/22/2018 Elsevier Patient Education  2020 [employer].

## 2020-03-21 NOTE — Progress Notes (Signed)
Patient ID: Breanna Weber, female    DOB: [birth year], 54 y.o.   MRN: [id number]  This visit was conducted in person.  BP 120/78 (BP Location: Left Arm, Patient Position: Sitting, Cuff Size: Large)   Pulse 66   Temp (!) 97.5 F (36.4 C) (Temporal)   Ht 5\' 8"  (1.727 m)   Wt 212 lb 1 oz (96.2 kg)   LMP 11/14/2015   SpO2 99%   BMI 32.24 kg/m    CC: CPE Subjective:   HPI: Breanna Weber is a 54 y.o. female presenting on 03/21/2020 for Annual Exam (C/o eczema flare up, especially during the colder weather. )   Eczema flares worse in the winters - notices to R posterior shoulder. Normally manages with vaseline and aquaphor with benefit. Triggers are dry weather and dehydration.   Preventative: COLONOSCOPY WITH PROPOFOL 04/10/2019 - HP, diverticulosis, rpt 10 yrs Bonna Gains, Lennette Bihari, MD) Well woman exam - 04/2019 Dr Ernestina Patches pap normal HPV pos - rec rpt 52yr (upcoming). Off OCPs. H/o fibroids.  LMP: 07/2014 Mammogram at [facility] 2017 - overdue for rpt. Waiting to see which imaging center is preferred by new insurance 04/2020.  Flu shot - yearly [place] 06/2019, 07/2019 planning to get booster Tetanus - 2004  shingrix - discussed, interested but will defer for now Seat belt use discussed  Sunscreen use discussed. No changing moles on skin.  Non smoker  Alcohol - rare  Dentist yearly  Eye exam - yearly   Caffeine: none Lives with son Husband passed 2011. 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 Interim medical history since our last visit reviewed. Allergies and medications reviewed and updated. Outpatient Medications Prior to Visit  Medication Sig Dispense  Refill  . acetaminophen (TYLENOL) 650 MG CR tablet Take 650 mg by mouth every 8 (eight) hours as needed for pain.    Marland Kitchen ibuprofen (ADVIL,MOTRIN) 200 MG tablet Take 600 mg by mouth every 6 (six) hours as needed.    . naproxen sodium (ALEVE) 220 MG tablet Take 220 mg by mouth as needed.     No facility-administered medications prior to visit.     Per HPI unless specifically indicated in ROS section below Review of Systems  Constitutional: Negative for activity change, appetite change, chills, fatigue, fever and unexpected weight change.  HENT: Negative for hearing loss.   Eyes: Negative for visual disturbance.  Respiratory: Negative for cough, chest tightness, shortness of breath and wheezing.   Cardiovascular: Negative for chest pain, palpitations and leg swelling.  Gastrointestinal: Negative for abdominal distention, abdominal pain, blood in stool, constipation, diarrhea, nausea and vomiting.  Genitourinary: Negative for difficulty urinating and hematuria.  Musculoskeletal: Negative for arthralgias, myalgias and neck pain.  Skin: Negative for rash.  Neurological: Negative for dizziness, seizures, syncope and headaches.  Hematological: Negative for adenopathy. Does not bruise/bleed easily.  Psychiatric/Behavioral: Negative for dysphoric mood. The patient is not nervous/anxious.    Objective:  BP 120/78 (BP Location: Left Arm, Patient Position: Sitting, Cuff Size: Large)   Pulse 66   Temp (!) 97.5 F (36.4 C) (Temporal)   Ht 5\' 8"  (1.727 m)   Wt 212 lb 1 oz (96.2 kg)   LMP 11/14/2015   SpO2  99%   BMI 32.24 kg/m   Wt Readings from Last 3 Encounters:  03/21/20 212 lb 1 oz (96.2 kg)  05/09/19 224 lb (101.6 kg)  04/10/19 223 lb (101.2 kg)      Physical Exam Vitals and nursing note reviewed.  Constitutional:      General: She is not in acute distress.    Appearance: Normal appearance. She is well-developed and well-nourished. She is not ill-appearing.  HENT:     Head:  Normocephalic and atraumatic.     Right Ear: Hearing, tympanic membrane, ear canal and external ear normal.     Left Ear: Hearing, tympanic membrane, ear canal and external ear normal.     Mouth/Throat:     Mouth: Oropharynx is clear and moist and mucous membranes are normal.     Pharynx: No posterior oropharyngeal edema.  Eyes:     General: No scleral icterus.    Extraocular Movements: Extraocular movements intact and EOM normal.     Conjunctiva/sclera: Conjunctivae normal.     Pupils: Pupils are equal, round, and reactive to light.  Neck:     Thyroid: No thyroid mass or thyromegaly.  Cardiovascular:     Rate and Rhythm: Normal rate and regular rhythm.     Pulses: Normal pulses and intact distal pulses.          Radial pulses are 2+ on the right side and 2+ on the left side.     Heart sounds: Normal heart sounds. No murmur heard.   Pulmonary:     Effort: Pulmonary effort is normal. No respiratory distress.     Breath sounds: Normal breath sounds. No wheezing, rhonchi or rales.  Abdominal:     General: Bowel sounds are normal. There is no distension.     Palpations: Abdomen is soft. There is no mass.     Tenderness: There is no abdominal tenderness. There is no guarding or rebound.     Hernia: No hernia is present.  Musculoskeletal:        General: No edema. Normal range of motion.     Cervical back: Normal range of motion and neck supple.     Right lower leg: No edema.     Left lower leg: No edema.  Lymphadenopathy:     Cervical: No cervical adenopathy.  Skin:    General: Skin is warm and dry.     Findings: Rash present.          Comments: Scaly itchy rash to R posterior shoulder   Neurological:     General: No focal deficit present.     Mental Status: She is alert and oriented to person, place, and time.     Comments: CN grossly intact, station and gait intact  Psychiatric:        Mood and Affect: Mood and affect and mood normal.        Behavior: Behavior normal.         Thought Content: Thought content normal.        Judgment: Judgment normal.       Results for orders placed or performed in visit on 03/19/20  VITAMIN D 25 Hydroxy (Vit-D Deficiency, Fractures)  Result Value Ref Range   VITD 13.39 (L) 30.00 - 100.00 ng/mL  Comprehensive metabolic panel  Result Value Ref Range   Sodium 137 135 - 145 mEq/L   Potassium 4.1 3.5 - 5.1 mEq/L   Chloride 102 96 - 112 mEq/L   CO2 29 19 - 32  mEq/L   Glucose, Bld 78 70 - 99 mg/dL   BUN 16 6 - 23 mg/dL   Creatinine, Ser 0.80 0.40 - 1.20 mg/dL   Total Bilirubin 0.5 0.2 - 1.2 mg/dL   Alkaline Phosphatase 88 39 - 117 U/L   AST 14 0 - 37 U/L   ALT 7 0 - 35 U/L   Total Protein 8.5 (H) 6.0 - 8.3 g/dL   Albumin 4.1 3.5 - 5.2 g/dL   GFR 82.15 >60.00 mL/min   Calcium 9.5 8.4 - 10.5 mg/dL  Lipid panel  Result Value Ref Range   Cholesterol 185 0 - 200 mg/dL   Triglycerides 68.0 0.0 - 149.0 mg/dL   HDL 61.50 >39.00 mg/dL   VLDL 13.6 0.0 - 40.0 mg/dL   LDL Cholesterol 110 (H) 0 - 99 mg/dL   Total CHOL/HDL Ratio 3    NonHDL 123.35    Assessment & Plan:  This visit occurred during the SARS-CoV-2 public health emergency.  Safety protocols were in place, including screening questions prior to the visit, additional usage of staff PPE, and extensive cleaning of exam room while observing appropriate contact time as indicated for disinfecting solutions.   Problem List Items Addressed This Visit    Vitamin D deficiency    Restart vit D 50k weekly, recheck in 6 months.       Relevant Orders   VITAMIN D 25 Hydroxy (Vit-D Deficiency, Fractures)   Obesity, Class I, BMI 30-34.9    12lb weight loss noted over the past year - attributes to walking and healthier diet. Continue to encourage healthy diet and lifestyle choices.       HLD (hyperlipidemia)    Chronic, stable off meds. The 10-year ASCVD risk score Mikey Bussing DC Brooke Bonito., et al., 2013) is: 1.6%   Values used to calculate the score:     Age: 63 years     Sex:  Female     Is Non-Hispanic African American: No     Diabetic: No     Tobacco smoker: No     Systolic Blood Pressure: 597 mmHg     Is BP treated: No     HDL Cholesterol: 61.5 mg/dL     Total Cholesterol: 185 mg/dL       Healthcare maintenance - Primary    Preventative protocols reviewed and updated unless pt declined. Discussed healthy diet and lifestyle.       Elevated blood protein    Mildly elevated - recheck at 6 mo labs.       Relevant Orders   Hepatic function panel   CBC with Differential/Platelet   Eczema    R posterior shoulder - Rx triamcinolone cream with steroid precautions.        Other Visit Diagnoses    Need for influenza vaccination       Relevant Orders   Flu Vaccine QUAD 36+ mos IM (Completed)       Meds ordered this encounter  Medications  . triamcinolone (KENALOG) 0.1 %    Sig: Apply 1 application topically 2 (two) times daily. Apply to AA for max 2 weeks at a time.    Dispense:  80 g    Refill:  1  . Cholecalciferol (VITAMIN D3) 1.25 MG (50000 UT) TABS    Sig: Take 1 tablet by mouth once a week.    Dispense:  12 tablet    Refill:  1   Orders Placed This Encounter  Procedures  . Flu Vaccine QUAD 36+ mos  IM  . Hepatic function panel    Standing Status:   Future    Standing Expiration Date:   03/21/2021  . VITAMIN D 25 Hydroxy (Vit-D Deficiency, Fractures)    Standing Status:   Future    Standing Expiration Date:   03/21/2021  . CBC with Differential/Platelet    Standing Status:   Future    Standing Expiration Date:   03/21/2021    Patient instructions: Flu shot today  Restart vitamin D 50,000 units weekly sent to pharmacy  For eczema - try triamcinolone cream sent to pharmacy.  You are doing well today  Return as needed or in 6 months for lab visit only then in 1 year for next physical.   Follow up plan: Return in about 1 year (around 03/21/2021) for annual exam, prior fasting for blood work.  Ria Bush, MD

## 2020-09-05 ENCOUNTER — Other Ambulatory Visit: Payer: Self-pay | Admitting: Family Medicine

## 2020-09-09 NOTE — Telephone Encounter (Signed)
E-scribed refill 

## 2020-09-13 ENCOUNTER — Other Ambulatory Visit: Payer: Self-pay | Admitting: Family Medicine

## 2020-09-19 ENCOUNTER — Other Ambulatory Visit: Payer: 59

## 2020-09-19 ENCOUNTER — Telehealth: Payer: Self-pay | Admitting: *Deleted

## 2020-09-19 NOTE — Telephone Encounter (Signed)
PLEASE NOTE: All timestamps contained within this report are represented as [place] Standard Time. CONFIDENTIALTY NOTICE: This fax transmission is intended only for the addressee. It contains information that is legally privileged, confidential or otherwise protected from use or disclosure. If you are not the intended recipient, you are strictly prohibited from reviewing, disclosing, copying using or disseminating any of this information or taking any action in reliance on or regarding this information. If you have received this fax in error, please notify us immediately by telephone so that we can arrange for its return to [place]. Phone: [phone], Toll-Free: [phone], Fax: [phone] Page: 1 of 1 Call Id: [id number] [facility] - Client Nonclinical Telephone Record  [facility] - Client Client Site [place] Physician Ria Bush - MD Contact Type Call Who Is Calling Patient / Member / Family / Caregiver Caller Name Breanna Weber Caller Phone Number [phone] Patient Name Breanna Weber Patient DOB 1965/04/18 Call Type Message Only Information Provided Reason for Call Request to [place] Appointment Initial Comment Caller states she needs to cancel her appointment. Patient request to speak to RN No Additional Comment Declined nurse triage. Provided office hours. Disp. Time Disposition Final User 09/18/2020 5:34:22 PM General Information Provided Yes Haynes Dage Call Closed By: Haynes Dage Transaction Date/Time: 09/18/2020 5:32:03 PM (ET)

## 2020-11-26 ENCOUNTER — Other Ambulatory Visit: Payer: Self-pay | Admitting: Family Medicine

## 2020-12-29 ENCOUNTER — Other Ambulatory Visit: Payer: Self-pay

## 2020-12-29 ENCOUNTER — Other Ambulatory Visit (HOSPITAL_COMMUNITY)
Admission: RE | Admit: 2020-12-29 | Discharge: 2020-12-29 | Disposition: A | Discharge status: Home / Self Care | Payer: 59 | Source: Ambulatory Visit | Attending: Obstetrics and Gynecology | Admitting: Obstetrics and Gynecology

## 2020-12-29 ENCOUNTER — Encounter: Payer: Self-pay | Admitting: Obstetrics and Gynecology

## 2020-12-29 ENCOUNTER — Ambulatory Visit (INDEPENDENT_AMBULATORY_CARE_PROVIDER_SITE_OTHER): Payer: 59 | Admitting: Obstetrics and Gynecology

## 2020-12-29 VITALS — BP 122/80 | HR 58 | Ht 70.0 in | Wt 229.0 lb

## 2020-12-29 DIAGNOSIS — N390 Urinary tract infection, site not specified: Secondary | ICD-10-CM

## 2020-12-29 DIAGNOSIS — N879 Dysplasia of cervix uteri, unspecified: Secondary | ICD-10-CM | POA: Insufficient documentation

## 2020-12-29 DIAGNOSIS — Z01419 Encounter for gynecological examination (general) (routine) without abnormal findings: Secondary | ICD-10-CM

## 2020-12-29 DIAGNOSIS — Z113 Encounter for screening for infections with a predominantly sexual mode of transmission: Secondary | ICD-10-CM | POA: Diagnosis not present

## 2020-12-29 DIAGNOSIS — R030 Elevated blood-pressure reading, without diagnosis of hypertension: Secondary | ICD-10-CM

## 2020-12-29 LAB — HEMOGLOBIN A1C
Est. average glucose Bld gHb Est-mCnc: 111 mg/dL
Hgb A1c MFr Bld: 5.5 % (ref 4.8–5.6)

## 2020-12-29 LAB — CBC
Hematocrit: 38.1 % (ref 34.0–46.6)
Hemoglobin: 12.6 g/dL (ref 11.1–15.9)
MCH: 29.6 pg (ref 26.6–33.0)
MCHC: 33.1 g/dL (ref 31.5–35.7)
MCV: 89 fL (ref 79–97)
Platelets: 267 10*3/uL (ref 150–450)
RBC: 4.26 x10E6/uL (ref 3.77–5.28)
RDW: 11.8 % (ref 11.7–15.4)
WBC: 4.8 10*3/uL (ref 3.4–10.8)

## 2020-12-29 NOTE — Progress Notes (Signed)
Obstetrics and Gynecology Annual Patient Evaluation  Appointment Date: 12/30/2020  OBGYN Clinic: [facility] for [facility]  Primary Care Provider: Ria Weber   Chief Complaint:  Chief Complaint  Patient presents with   Gynecologic Exam    History of Present Illness: Breanna Weber is a 55 y.o. African-American G3P1 (Patient's last menstrual period was 11/14/2015.), seen for the above chief complaint.  +post coital UTIs since menopause; pt already voids right after sex. Prior bactrim use helps with prevention. Some vaginal dryness  Review of Systems: Pertinent items noted in HPI and remainder of comprehensive ROS otherwise negative.   Patient Active Problem List   Diagnosis Date Noted   Elevated blood protein 03/21/2020   Eczema 03/21/2020   Vitamin D deficiency 03/15/2020   Acute hemorrhagic cystitis 04/02/2019   Elevated sedimentation rate 03/19/2019   Headache 02/13/2019   Hot flashes 08/16/2017   Bilateral knee pain 11/19/2015   Obesity, Class I, BMI 30-34.9 05/12/2015   HLD (hyperlipidemia) 05/12/2015   Left shoulder pain 05/12/2015   Fibroids    Healthcare maintenance 07/12/2011    Past Medical History:  Past Medical History:  Diagnosis Date   COVID-19 virus detected 11/07/2018   outpatient swab, dx'd on 11/07/2018   Fibroids    History of chicken pox    Uterine fibroid    removed    Past Surgical History:  Past Surgical History:  Procedure Laterality Date   CESAREAN SECTION  2002/06/29   COLONOSCOPY WITH PROPOFOL N/A 04/10/2019   HP, diverticulosis, rpt 10 yrs (Tahiliani, Lennette Bihari, MD)   DIAGNOSTIC LAPAROSCOPY     GANGLION CYST EXCISION  x2   MYOMECTOMY  2001   Breanna Weber  1999/06/29   UTERINE FIBROID SURGERY  06-28-2000    Past Obstetrical History:  OB History  Gravida Para Term Preterm AB Living  3 1     2 1   SAB IAB Ectopic Multiple Live Births  2            # Outcome Date GA Lbr Len/2nd Weight Sex Delivery Anes  PTL Lv  3 Para 29-Jun-2002 CS-LTranv     2 SAB           1 SAB             Past Gynecological History: As per HPI. History of Pap Smear(s): Yes.   Last pap 06/29/2019, which was negative cytology but +HPV but negative 16/18/45 History of HRT use: No.  Social History:  Social History   Socioeconomic History   Marital status: Widowed    Spouse name: Not on file   Number of children: Not on file   Years of education: Not on file   Highest education level: Not on file  Occupational History   Not on file  Tobacco Use   Smoking status: Never   Smokeless tobacco: Never  Vaping Use   Vaping Use: Never used  Substance and Sexual Activity   Alcohol use: No    Alcohol/week: 0.0 standard drinks    Comment: Occasional   Drug use: No   Sexual activity: Yes    Partners: Male    Birth control/protection: None, Condom  Other Topics Concern   Not on file  Social History Narrative   Caffeine: none   Lives with son   Husband died 06/28/2009 in [place], moved to be closer to family.   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)   Edu: post MED,  working on doctorate   Activity: no regular exercise   Diet: good water, fruits/vegetables daily, red meat 3x/wk, fish seldom   Social Determinants of Radio broadcast assistant Strain: Not on file  Food Insecurity: Not on file  Transportation Needs: Not on file  Physical Activity: Not on file  Stress: Not on file  Social Connections: Not on file  Intimate Partner Violence: Not on file    Family History:  Family History  Problem Relation Age of Onset   Hypertension Mother    Asthma Mother    Hypertension Maternal Grandmother    Cancer Maternal Grandmother        ovarian cancer   Stroke Maternal Grandmother    Coronary artery disease Maternal Grandmother 65   Thyroid disease Maternal Grandmother    Diabetes Maternal Grandfather    Hypertension Maternal Grandfather    Thyroid disease Sister    Thyroid  disease Maternal Aunt    She denies any female cancers  Health Maintenance:  Colonoscopy: 2021  Medications Breanna Weber had no medications administered during this visit. No current outpatient medications on file.   No current facility-administered medications for this visit.    Allergies Aspirin and Ciprofloxacin   Physical Exam:  BP 122/80   Pulse (!) 58   Ht 5\' 10"  (1.778 m)   Wt 229 lb (103.9 kg)   LMP 11/14/2015   BMI 32.86 kg/m  Body mass index is 32.86 kg/m. General appearance: Well nourished, well developed female in no acute distress.  Cardiovascular: normal s1 and s2.  No murmurs, rubs or gallops. Respiratory:  Clear to auscultation bilateral. Normal respiratory effort Abdomen: positive bowel sounds and no masses, hernias; diffusely non tender to palpation, non distended Breasts: breasts appear normal, no suspicious masses, no skin or nipple changes or axillary nodes, and negative palpation. Neuro/Psych:  Normal mood and affect.  Skin:  Warm and dry.  Lymphatic:  No inguinal lymphadenopathy.   Pelvic exam: is not limited by body habitus EGBUS: within normal limits Vagina: within normal limits and with no blood or discharge in the vault Cervix: normal appearing cervix without tenderness, discharge or lesions.  Uterus:  nonenlarged and non tender Adnexa:  normal adnexa and no mass, fullness, tenderness Rectovaginal: deferred  Laboratory: none  Radiology: none  Assessment: pt doing well  Plan:  1. Screen for STD (sexually transmitted disease) Pt desired  2. Frequent UTI Bactrim SS once PRN sent in  - Cervicovaginal ancillary only( [place])  3. Well woman exam with routine gynecological exam Routine care. Pt desires labs today in preparation for PCP annual later this year - Cytology - PAP - CBC - TSH - Hemoglobin A1c - Comprehensive metabolic panel - Lipid panel - MM 3D SCREEN BREAST BILATERAL; Future - Hepatitis B surface antigen -  Hepatitis C antibody - HIV Antibody (routine testing w rflx) - RPR - Cervicovaginal ancillary only( [place])  4. Cervical dysplasia - Cytology - PAP  5. Transient hypertension Normal on re-check   RTC PRN  Durene Romans MD Attending Center for [facility])

## 2020-12-30 LAB — LIPID PANEL
Chol/HDL Ratio: 3.2 ratio (ref 0.0–4.4)
Cholesterol, Total: 220 mg/dL — ABNORMAL HIGH (ref 100–199)
HDL: 68 mg/dL (ref >39)
LDL Chol Calc (NIH): 143 mg/dL — ABNORMAL HIGH (ref 0–99)
Triglycerides: 53 mg/dL (ref 0–149)
VLDL Cholesterol Cal: 9 mg/dL (ref 5–40)

## 2020-12-30 LAB — COMPREHENSIVE METABOLIC PANEL
ALT: 11 IU/L (ref 0–32)
AST: 20 IU/L (ref 0–40)
Albumin/Globulin Ratio: 1.1 — ABNORMAL LOW (ref 1.2–2.2)
Albumin: 4.2 g/dL (ref 3.8–4.9)
Alkaline Phosphatase: 126 IU/L — ABNORMAL HIGH (ref 44–121)
BUN/Creatinine Ratio: 14 (ref 9–23)
BUN: 11 mg/dL (ref 6–24)
Bilirubin Total: 0.3 mg/dL (ref 0.0–1.2)
CO2: 24 mmol/L (ref 20–29)
Calcium: 9.5 mg/dL (ref 8.7–10.2)
Chloride: 102 mmol/L (ref 96–106)
Creatinine, Ser: 0.81 mg/dL (ref 0.57–1.00)
Globulin, Total: 3.7 g/dL (ref 1.5–4.5)
Glucose: 83 mg/dL (ref 65–99)
Potassium: 4 mmol/L (ref 3.5–5.2)
Sodium: 139 mmol/L (ref 134–144)
Total Protein: 7.9 g/dL (ref 6.0–8.5)
eGFR: 85 mL/min/{1.73_m2} (ref >59)

## 2020-12-30 LAB — RPR: RPR Ser Ql: NONREACTIVE

## 2020-12-30 LAB — HEPATITIS C ANTIBODY: Hep C Virus Ab: <0.1 s/co ratio (ref 0.0–0.9)

## 2020-12-30 LAB — HEPATITIS B SURFACE ANTIGEN: Hepatitis B Surface Ag: NEGATIVE

## 2020-12-30 LAB — TSH: TSH: 1.26 u[IU]/mL (ref 0.450–4.500)

## 2020-12-30 LAB — HIV ANTIBODY (ROUTINE TESTING W REFLEX): HIV Screen 4th Generation wRfx: NONREACTIVE

## 2020-12-30 MED ORDER — SULFAMETHOXAZOLE-TRIMETHOPRIM 400-80 MG PO TABS: 1.0000 | ORAL_TABLET | Freq: Once | ORAL | 1 refills | Status: AC | PRN

## 2020-12-31 LAB — CYTOLOGY - PAP
Adequacy: ABSENT
Chlamydia: NEGATIVE
Comment: NEGATIVE
Comment: NEGATIVE
Comment: NEGATIVE
Comment: NORMAL
Diagnosis: NEGATIVE
High risk HPV: NEGATIVE
Neisseria Gonorrhea: NEGATIVE
Trichomonas: NEGATIVE

## 2020-12-31 LAB — CERVICOVAGINAL ANCILLARY ONLY
Bacterial Vaginitis (gardnerella): POSITIVE — AB
Candida Glabrata: NEGATIVE
Candida Vaginitis: NEGATIVE
Chlamydia: NEGATIVE
Comment: NEGATIVE
Comment: NEGATIVE
Comment: NEGATIVE
Comment: NEGATIVE
Comment: NEGATIVE
Comment: NORMAL
Neisseria Gonorrhea: NEGATIVE
Trichomonas: NEGATIVE

## 2021-01-14 ENCOUNTER — Ambulatory Visit
Admission: RE | Admit: 2021-01-14 | Discharge: 2021-01-14 | Disposition: A | Discharge status: Home / Self Care | Payer: 59 | Source: Ambulatory Visit | Attending: Obstetrics and Gynecology | Admitting: Obstetrics and Gynecology

## 2021-01-14 ENCOUNTER — Other Ambulatory Visit: Payer: Self-pay

## 2021-01-14 DIAGNOSIS — Z1231 Encounter for screening mammogram for malignant neoplasm of breast: Secondary | ICD-10-CM | POA: Diagnosis not present

## 2021-01-14 DIAGNOSIS — Z01419 Encounter for gynecological examination (general) (routine) without abnormal findings: Secondary | ICD-10-CM | POA: Insufficient documentation

## 2021-01-26 ENCOUNTER — Encounter: Payer: Self-pay | Admitting: Obstetrics and Gynecology

## 2021-01-26 DIAGNOSIS — E7800 Pure hypercholesterolemia, unspecified: Secondary | ICD-10-CM | POA: Insufficient documentation

## 2021-01-26 DIAGNOSIS — E785 Hyperlipidemia, unspecified: Secondary | ICD-10-CM | POA: Insufficient documentation

## 2021-01-26 DIAGNOSIS — R748 Abnormal levels of other serum enzymes: Secondary | ICD-10-CM | POA: Insufficient documentation

## 2021-03-25 ENCOUNTER — Other Ambulatory Visit: Payer: Self-pay

## 2021-03-25 ENCOUNTER — Encounter: Payer: Self-pay | Admitting: Family Medicine

## 2021-03-25 ENCOUNTER — Ambulatory Visit (INDEPENDENT_AMBULATORY_CARE_PROVIDER_SITE_OTHER): Payer: 59 | Admitting: Family Medicine

## 2021-03-25 VITALS — BP 130/64 | HR 83 | Temp 97.5°F | Ht 67.75 in | Wt 234.4 lb

## 2021-03-25 DIAGNOSIS — E785 Hyperlipidemia, unspecified: Secondary | ICD-10-CM

## 2021-03-25 DIAGNOSIS — E559 Vitamin D deficiency, unspecified: Secondary | ICD-10-CM

## 2021-03-25 DIAGNOSIS — Z23 Encounter for immunization: Secondary | ICD-10-CM | POA: Diagnosis not present

## 2021-03-25 DIAGNOSIS — Z0000 Encounter for general adult medical examination without abnormal findings: Secondary | ICD-10-CM | POA: Diagnosis not present

## 2021-03-25 DIAGNOSIS — M25561 Pain in right knee: Secondary | ICD-10-CM | POA: Diagnosis not present

## 2021-03-25 DIAGNOSIS — R748 Abnormal levels of other serum enzymes: Secondary | ICD-10-CM

## 2021-03-25 DIAGNOSIS — M25562 Pain in left knee: Secondary | ICD-10-CM

## 2021-03-25 DIAGNOSIS — G8929 Other chronic pain: Secondary | ICD-10-CM

## 2021-03-25 MED ORDER — VITAMIN D 50 MCG (2000 UT) PO CAPS: 1.0000 | ORAL_CAPSULE | Freq: Every day | ORAL | Status: DC

## 2021-03-25 NOTE — Assessment & Plan Note (Signed)
Encouraged healthy diet and lifestyle choices to affect sustainable weight loss.  ?

## 2021-03-25 NOTE — Assessment & Plan Note (Signed)
Mildly elevated. Pt asxs. RTC 3-4 mo repeat labs.

## 2021-03-25 NOTE — Assessment & Plan Note (Addendum)
Chronic, deteriorated off medications however ASCVD risk remains low. Reviewed diet choices to improve cholesterol The 10-year ASCVD risk score (Arnett DK, et al., 2019) is: 2.3%   Values used to calculate the score:     Age: 55 years     Sex: Female     Is Non-Hispanic African American: No     Diabetic: No     Tobacco smoker: No     Systolic Blood Pressure: 569 mmHg     Is BP treated: No     HDL Cholesterol: 68 mg/dL     Total Cholesterol: 220 mg/dL

## 2021-03-25 NOTE — Assessment & Plan Note (Addendum)
rec start vit D 2000 IU daily.  

## 2021-03-25 NOTE — Patient Instructions (Addendum)
Flu shot today. First shingrix vaccine today. Return in 2-6 months for nurse visit to complete series.  Work on increased fiber and legumes in diet to help lower cholesterol levels. Return in 4 months for lab visit only to recheck liver and cholesterol levels.  Good to see you today. Return as needed or in 1 year for next physical.   Health Maintenance for Postmenopausal Women Menopause is a normal process in which your ability to get pregnant comes to an end. This process happens slowly over many months or years, usually between the ages of 45 and 33. Menopause is complete when you have missed your menstrual period for 12 months. It is important to talk with your health care provider about some of the most common conditions that affect women after menopause (postmenopausal women). These include heart disease, cancer, and bone loss (osteoporosis). Adopting a healthy lifestyle and getting preventive care can help to promote your health and wellness. The actions you take can also lower your chances of developing some of these common conditions. What are the signs and symptoms of menopause? During menopause, you may have the following symptoms: Hot flashes. These can be moderate or severe. Night sweats. Decrease in sex drive. Mood swings. Headaches. Tiredness (fatigue). Irritability. Memory problems. Problems falling asleep or staying asleep. Talk with your health care provider about treatment options for your symptoms. Do I need hormone replacement therapy? Hormone replacement therapy is effective in treating symptoms that are caused by menopause, such as hot flashes and night sweats. Hormone replacement carries certain risks, especially as you become older. If you are thinking about using estrogen or estrogen with progestin, discuss the benefits and risks with your health care provider. How can I reduce my risk for heart disease and stroke? The risk of heart disease, heart attack, and stroke  increases as you age. One of the causes may be a change in the body's hormones during menopause. This can affect how your body uses dietary fats, triglycerides, and cholesterol. Heart attack and stroke are medical emergencies. There are many things that you can do to help prevent heart disease and stroke. Watch your blood pressure High blood pressure causes heart disease and increases the risk of stroke. This is more likely to develop in people who have high blood pressure readings or are overweight. Have your blood pressure checked: Every 3-5 years if you are 80-24 years of age. Every year if you are 71 years old or older. Eat a healthy diet  Eat a diet that includes plenty of vegetables, fruits, low-fat dairy products, and lean protein. Do not eat a lot of foods that are high in solid fats, added sugars, or sodium. Get regular exercise Get regular exercise. This is one of the most important things you can do for your health. Most adults should: Try to exercise for at least 150 minutes each week. The exercise should increase your heart rate and make you sweat (moderate-intensity exercise). Try to do strengthening exercises at least twice each week. Do these in addition to the moderate-intensity exercise. Spend less time sitting. Even light physical activity can be beneficial. Other tips Work with your health care provider to achieve or maintain a healthy weight. Do not use any products that contain nicotine or tobacco. These products include cigarettes, chewing tobacco, and vaping devices, such as e-cigarettes. If you need help quitting, ask your health care provider. Know your numbers. Ask your health care provider to check your cholesterol and your blood sugar (glucose). Continue  to have your blood tested as directed by your health care provider. Do I need screening for cancer? Depending on your health history and family history, you may need to have cancer screenings at different stages of  your life. This may include screening for: Breast cancer. Cervical cancer. Lung cancer. Colorectal cancer. What is my risk for osteoporosis? After menopause, you may be at increased risk for osteoporosis. Osteoporosis is a condition in which bone destruction happens more quickly than new bone creation. To help prevent osteoporosis or the bone fractures that can happen because of osteoporosis, you may take the following actions: If you are 38-24 years old, get at least 1,000 mg of calcium and at least 600 international units (IU) of vitamin D per day. If you are older than age 40 but younger than age 63, get at least 1,200 mg of calcium and at least 600 international units (IU) of vitamin D per day. If you are older than age 49, get at least 1,200 mg of calcium and at least 800 international units (IU) of vitamin D per day. Smoking and drinking excessive alcohol increase the risk of osteoporosis. Eat foods that are rich in calcium and vitamin D, and do weight-bearing exercises several times each week as directed by your health care provider. How does menopause affect my mental health? Depression may occur at any age, but it is more common as you become older. Common symptoms of depression include: Feeling depressed. Changes in sleep patterns. Changes in appetite or eating patterns. Feeling an overall lack of motivation or enjoyment of activities that you previously enjoyed. Frequent crying spells. Talk with your health care provider if you think that you are experiencing any of these symptoms. General instructions See your health care provider for regular wellness exams and vaccines. This may include: Scheduling regular health, dental, and eye exams. Getting and maintaining your vaccines. These include: Influenza vaccine. Get this vaccine each year before the flu season begins. Pneumonia vaccine. Shingles vaccine. Tetanus, diphtheria, and pertussis (Tdap) booster vaccine. Your health care  provider may also recommend other immunizations. Tell your health care provider if you have ever been abused or do not feel safe at home. Summary Menopause is a normal process in which your ability to get pregnant comes to an end. This condition causes hot flashes, night sweats, decreased interest in sex, mood swings, headaches, or lack of sleep. Treatment for this condition may include hormone replacement therapy. Take actions to keep yourself healthy, including exercising regularly, eating a healthy diet, watching your weight, and checking your blood pressure and blood sugar levels. Get screened for cancer and depression. Make sure that you are up to date with all your vaccines. This information is not intended to replace advice given to you by your health care provider. Make sure you discuss any questions you have with your health care provider. Document Revised: 08/18/2020 Document Reviewed: 08/18/2020 Elsevier Patient Education  [employer].

## 2021-03-25 NOTE — Assessment & Plan Note (Signed)
Preventative protocols reviewed and updated unless pt declined. Discussed healthy diet and lifestyle.  

## 2021-03-25 NOTE — Progress Notes (Signed)
Patient ID: Breanna Weber, female    DOB: 22-Jul-1965, 55 y.o.   MRN: [id number]  This visit was conducted in person.  BP 130/64    Pulse 83    Temp (!) 97.5 F (36.4 C) (Temporal)    Ht 5' 7.75" (1.721 m)    Wt 234 lb 7 oz (106.3 kg)    LMP 11/14/2015    SpO2 98%    BMI 35.91 kg/m    CC: CPE Subjective:   HPI: Breanna Weber is a 55 y.o. female presenting on 03/25/2021 for Annual Exam   Weight gain noted - attributes to increased snacks.  Eczema flares worse in the winter months - better this year. Normally manages with vaseline and aquaphor with benefit. Triggers are dry weather and dehydration.   GYN started PRN bactrim SS to avoid post-coital UTIs. This has worked well. Will request refill through GYN  Preventative: COLONOSCOPY WITH PROPOFOL 04/10/2019 - HP, diverticulosis, rpt 10 yrs (Bonna Gains, Lennette Bihari, MD) Well woman exam with OBGYN Dr Ilda Basset, last seen 12/2020 with normal pap smear. H/o fibroids.  LMP: 07/2014 Mammogram [facility] 01/2021 Lung cancer screening - not eligible  Flu shot - yearly [place] 06/2019, 07/2019  Tetanus - 2004 shingrix - discussed, will start today.  Seat belt use discussed  Sunscreen use discussed. No changing moles on skin.  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 Interim medical history since our last visit reviewed. Allergies and medications reviewed and updated. Outpatient Medications Prior to Visit  Medication Sig Dispense Refill    sulfamethoxazole-trimethoprim (BACTRIM) 400-80 MG tablet Take 1 tablet by mouth once as needed for up to 1 dose. After sex 30 tablet 1   No facility-administered medications prior to visit.     Per HPI unless specifically indicated in ROS section below Review of Systems  Constitutional:  Negative for activity change, appetite change, chills, fatigue, fever and unexpected weight change.  HENT:  Negative for hearing loss.   Eyes:  Negative for visual disturbance.  Respiratory:  Negative for cough, chest tightness, shortness of breath and wheezing.   Cardiovascular:  Negative for chest pain, palpitations and leg swelling.  Gastrointestinal:  Negative for abdominal distention, abdominal pain, blood in stool, constipation, diarrhea, nausea and vomiting.  Genitourinary:  Negative for difficulty urinating and hematuria.  Musculoskeletal:  Negative for arthralgias, myalgias and neck pain.  Skin:  Negative for rash.  Neurological:  Negative for dizziness, seizures, syncope and headaches.  Hematological:  Negative for adenopathy. Does not bruise/bleed easily.  Psychiatric/Behavioral:  Negative for dysphoric mood. The patient is not nervous/anxious.    Objective:  BP 130/64    Pulse 83    Temp (!) 97.5 F (36.4 C) (Temporal)    Ht 5' 7.75" (1.721 m)    Wt 234 lb 7 oz (106.3 kg)    LMP 11/14/2015    SpO2 98%    BMI 35.91 kg/m   Wt Readings from Last 3 Encounters:  03/25/21 234 lb 7 oz (106.3 kg)  12/29/20 229 lb (  103.9 kg)  03/21/20 212 lb 1 oz (96.2 kg)      Physical Exam Vitals and nursing note reviewed.  Constitutional:      Appearance: Normal appearance. She is not ill-appearing.  HENT:     Head: Normocephalic and atraumatic.     Right Ear: Tympanic membrane, ear canal and external ear normal. There is no impacted cerumen.     Left Ear: Tympanic membrane, ear canal and external ear normal. There is no impacted cerumen.  Eyes:     General:        Right eye: No discharge.        Left  eye: No discharge.     Extraocular Movements: Extraocular movements intact.     Conjunctiva/sclera: Conjunctivae normal.     Pupils: Pupils are equal, round, and reactive to light.  Neck:     Thyroid: No thyroid mass or thyromegaly.  Cardiovascular:     Rate and Rhythm: Normal rate and regular rhythm.     Pulses: Normal pulses.     Heart sounds: Normal heart sounds. No murmur heard. Pulmonary:     Effort: Pulmonary effort is normal. No respiratory distress.     Breath sounds: Normal breath sounds. No wheezing, rhonchi or rales.  Abdominal:     General: Bowel sounds are normal. There is no distension.     Palpations: Abdomen is soft. There is no mass.     Tenderness: There is no abdominal tenderness. There is no guarding or rebound.     Hernia: No hernia is present.  Musculoskeletal:     Cervical back: Normal range of motion and neck supple. No rigidity.     Right lower leg: No edema.     Left lower leg: No edema.  Lymphadenopathy:     Cervical: No cervical adenopathy.  Skin:    General: Skin is warm and dry.     Findings: No rash.  Neurological:     General: No focal deficit present.     Mental Status: She is alert. Mental status is at baseline.  Psychiatric:        Mood and Affect: Mood normal.        Behavior: Behavior normal.      Results for orders placed or performed in visit on 12/29/20  CBC  Result Value Ref Range   WBC 4.8 3.4 - 10.8 x10E3/uL   RBC 4.26 3.77 - 5.28 x10E6/uL   Hemoglobin 12.6 11.1 - 15.9 g/dL   Hematocrit 38.1 34.0 - 46.6 %   MCV 89 79 - 97 fL   MCH 29.6 26.6 - 33.0 pg   MCHC 33.1 31.5 - 35.7 g/dL   RDW 11.8 11.7 - 15.4 %   Platelets 267 150 - 450 x10E3/uL  TSH  Result Value Ref Range   TSH 1.260 0.450 - 4.500 uIU/mL  Hemoglobin A1c  Result Value Ref Range   Hgb A1c MFr Bld 5.5 4.8 - 5.6 %   Est. average glucose Bld gHb Est-mCnc 111 mg/dL  Comprehensive metabolic panel  Result Value Ref Range   Glucose 83 65 - 99 mg/dL   BUN 11 6 - 24  mg/dL   Creatinine, Ser 0.81 0.57 - 1.00 mg/dL   eGFR 85 >59 mL/min/1.73   BUN/Creatinine Ratio 14 9 - 23   Sodium 139 134 - 144 mmol/L   Potassium 4.0 3.5 - 5.2 mmol/L   Chloride 102 96 - 106 mmol/L   CO2 24 20 - 29 mmol/L  Calcium 9.5 8.7 - 10.2 mg/dL   Total Protein 7.9 6.0 - 8.5 g/dL   Albumin 4.2 3.8 - 4.9 g/dL   Globulin, Total 3.7 1.5 - 4.5 g/dL   Albumin/Globulin Ratio 1.1 (L) 1.2 - 2.2   Bilirubin Total 0.3 0.0 - 1.2 mg/dL   Alkaline Phosphatase 126 (H) 44 - 121 IU/L   AST 20 0 - 40 IU/L   ALT 11 0 - 32 IU/L  Lipid panel  Result Value Ref Range   Cholesterol, Total 220 (H) 100 - 199 mg/dL   Triglycerides 53 0 - 149 mg/dL   HDL 68 >39 mg/dL   VLDL Cholesterol Cal 9 5 - 40 mg/dL   LDL Chol Calc (NIH) 143 (H) 0 - 99 mg/dL   Chol/HDL Ratio 3.2 0.0 - 4.4 ratio  Hepatitis B surface antigen  Result Value Ref Range   Hepatitis B Surface Ag Negative Negative  Hepatitis C antibody  Result Value Ref Range   Hep C Virus Ab <0.1 0.0 - 0.9 s/co ratio  HIV Antibody (routine testing w rflx)  Result Value Ref Range   HIV Screen 4th Generation wRfx Non Reactive Non Reactive  RPR  Result Value Ref Range   RPR Ser Ql Non Reactive Non Reactive  Cytology - PAP  Result Value Ref Range   High risk HPV Negative    Neisseria Gonorrhea Negative    Chlamydia Negative    Trichomonas Negative    Adequacy      Satisfactory for evaluation; transformation zone component ABSENT.   Diagnosis      - Negative for intraepithelial lesion or malignancy (NILM)   Comment Normal Reference Range HPV - Negative    Comment Normal Reference Range Trichomonas - Negative    Comment Normal Reference [place] Chlamydia - Negative    Comment      Normal Reference Range Neisseria Gonorrhea - Negative  Cervicovaginal ancillary only( [place])  Result Value Ref Range   Neisseria Gonorrhea Negative    Chlamydia Negative    Trichomonas Negative    Bacterial Vaginitis (gardnerella) Positive (A)    Candida  Vaginitis Negative    Candida Glabrata Negative    Comment Normal Reference Range Candida Species - Negative    Comment Normal Reference Range Candida Galbrata - Negative    Comment Normal Reference Range Trichomonas - Negative    Comment Normal Reference [place] Chlamydia - Negative    Comment      Normal Reference Range Neisseria Gonorrhea - Negative   Comment      Normal Reference Range Bacterial Vaginosis - Negative    Assessment & Plan:  This visit occurred during the SARS-CoV-2 public health emergency.  Safety protocols were in place, including screening questions prior to the visit, additional usage of staff PPE, and extensive cleaning of exam room while observing appropriate contact time as indicated for disinfecting solutions.   Problem List Items Addressed This Visit     Healthcare maintenance - Primary (Chronic)    Preventative protocols reviewed and updated unless pt declined. Discussed healthy diet and lifestyle.       Severe obesity (BMI 35.0-39.9) with comorbidity ([place])    Encouraged healthy diet and lifestyle choices to affect sustainable weight loss.       HLD (hyperlipidemia)    Chronic, deteriorated off medications however ASCVD risk remains low. Reviewed diet choices to improve cholesterol The 10-year ASCVD risk score (Arnett DK, et al., 2019) is: 2.3%   Values used to calculate the  score:     Age: 66 years     Sex: Female     Is Non-Hispanic African American: No     Diabetic: No     Tobacco smoker: No     Systolic Blood Pressure: 927 mmHg     Is BP treated: No     HDL Cholesterol: 68 mg/dL     Total Cholesterol: 220 mg/dL       Relevant Orders   Lipid panel   Bilateral knee pain    Discussed supplements which may be of benefit including vit D, turmeric, glucosamine.       Vitamin D deficiency    rec start vit D 2000 IU daily.       Relevant Orders   VITAMIN D 25 Hydroxy (Vit-D Deficiency, Fractures)   Elevated alkaline phosphatase level     Mildly elevated. Pt asxs. RTC 3-4 mo repeat labs.       Relevant Orders   Hepatic function panel   Other Visit Diagnoses     Need for influenza vaccination       Relevant Orders   Flu Vaccine QUAD 57moIM (Fluarix, Fluzone & Alfiuria Quad PF) (Completed)   Need for shingles vaccine       Relevant Orders   Varicella-zoster vaccine IM (Completed)        Meds ordered this encounter  Medications   Cholecalciferol (VITAMIN D) 50 MCG (2000 UT) CAPS    Sig: Take 1 capsule (2,000 Units total) by mouth daily.    Dispense:  30 capsule    Orders Placed This Encounter  Procedures   Flu Vaccine QUAD 625moM (Fluarix, Fluzone & Alfiuria Quad PF)   Varicella-zoster vaccine IM   Hepatic function panel    Standing Status:   Future    Standing Expiration Date:   03/25/2022   Lipid panel    Standing Status:   Future    Standing Expiration Date:   03/25/2022   VITAMIN D 25 Hydroxy (Vit-D Deficiency, Fractures)    Standing Status:   Future    Standing Expiration Date:   03/25/2022    Patient instructions: Flu shot today. First shingrix vaccine today. Return in 2-6 months for nurse visit to complete series.  Work on increased fiber and legumes in diet to help lower cholesterol levels. Return in 4 months for lab visit only to recheck liver and cholesterol levels.  Good to see you today. Return as needed or in 1 year for next physical.   Follow up plan: Return in about 1 year (around 03/25/2022) for annual exam, prior fasting for blood work.  JaRia BushMD

## 2021-03-25 NOTE — Assessment & Plan Note (Signed)
Discussed supplements which may be of benefit including vit D, turmeric, glucosamine.

## 2021-07-22 ENCOUNTER — Ambulatory Visit: Payer: 59

## 2021-07-22 ENCOUNTER — Other Ambulatory Visit: Payer: 59

## 2021-09-30 ENCOUNTER — Other Ambulatory Visit: Payer: Self-pay

## 2021-09-30 ENCOUNTER — Encounter: Payer: Self-pay | Admitting: *Deleted

## 2021-09-30 ENCOUNTER — Emergency Department
Admission: EM | Admit: 2021-09-30 | Discharge: 2021-10-01 | Disposition: A | Discharge status: Home / Self Care | Payer: Commercial Managed Care - HMO | Attending: Emergency Medicine | Admitting: Emergency Medicine

## 2021-09-30 DIAGNOSIS — I10 Essential (primary) hypertension: Secondary | ICD-10-CM | POA: Insufficient documentation

## 2021-09-30 DIAGNOSIS — R030 Elevated blood-pressure reading, without diagnosis of hypertension: Secondary | ICD-10-CM | POA: Diagnosis present

## 2021-09-30 LAB — BASIC METABOLIC PANEL
Anion gap: 6 (ref 5–15)
BUN: 11 mg/dL (ref 6–20)
CO2: 26 mmol/L (ref 22–32)
Calcium: 9.6 mg/dL (ref 8.9–10.3)
Chloride: 105 mmol/L (ref 98–111)
Creatinine, Ser: 0.68 mg/dL (ref 0.44–1.00)
GFR, Estimated: >60 mL/min (ref >60)
Glucose, Bld: 90 mg/dL (ref 70–99)
Potassium: 3.5 mmol/L (ref 3.5–5.1)
Sodium: 137 mmol/L (ref 135–145)

## 2021-09-30 LAB — CBC
HCT: 39.7 % (ref 36.0–46.0)
Hemoglobin: 12.5 g/dL (ref 12.0–15.0)
MCH: 28.9 pg (ref 26.0–34.0)
MCHC: 31.5 g/dL (ref 30.0–36.0)
MCV: 91.7 fL (ref 80.0–100.0)
Platelets: 268 10*3/uL (ref 150–400)
RBC: 4.33 MIL/uL (ref 3.87–5.11)
RDW: 11.9 % (ref 11.5–15.5)
WBC: 5.6 10*3/uL (ref 4.0–10.5)
nRBC: 0 % (ref 0.0–0.2)

## 2021-09-30 NOTE — ED Triage Notes (Addendum)
Pt reports elevated blood pressure for 1-2 weeks.  Pt takes no meds. No n/v/  no chest pain or sob. No headache.   Pt alert  speech clear.

## 2021-10-01 NOTE — ED Provider Notes (Signed)
[facility] Provider Note    Event Date/Time   First MD Initiated Contact with Patient 09/30/21 2324     (approximate)   History   Hypertension   HPI  Breanna Weber is a 56 y.o. female who presents for evaluation of high blood pressure.  She said that she has not felt quite right intermittently for the last couple of weeks, with occasional lightheadedness and sometimes a little bit of a headache.  She has had no fever, chest pain, shortness of breath, nausea, vomiting, headache, nor abdominal pain.  She said that she "finally figured it out" when she checked her blood pressure tonight and it was quite elevated, in the 180/100 range.  She came to the ED for further evaluation.   She goes to Dr. Danise Mina for primary care but has not been diagnosed previously with hypertension.  She has an appointment with him in 2 days but was afraid to wait that long.  She is currently asymptomatic.     Physical Exam   Triage Vital Signs: ED Triage Vitals  Enc Vitals Group     BP 09/30/21 2235 (!) 164/89     Pulse Rate 09/30/21 2235 67     Resp 09/30/21 2235 20     Temp 09/30/21 2235 98 F (36.7 C)     Temp Source 09/30/21 2235 Oral     SpO2 09/30/21 2235 98 %     Weight 09/30/21 2236 99.8 kg (220 lb)     Height 09/30/21 2236 1.778 m ('5\' 10"'$ )     Head Circumference --      Peak Flow --      Pain Score 09/30/21 2235 0     Pain Loc --      Pain Edu? --      Excl. in [place]? --     Most recent vital signs: Vitals:   09/30/21 2235 10/01/21 0000  BP: (!) 164/89 136/83  Pulse: 67 (!) 56  Resp: 20 16  Temp: 98 F (36.7 C)   SpO2: 98% 100%     General: Awake, no distress.  CV:  Good peripheral perfusion.  Normal heart sounds. Resp:  Normal effort.  Lungs are clear to auscultation bilaterally. Abd:  No distention.  No tenderness to palpation of the abdomen. Other:  No focal neurological deficits appreciated.  Pupils are equal and reactive.   ED Results /  Procedures / Treatments   Labs (all labs ordered are listed, but only abnormal results are displayed) Labs Reviewed  BASIC METABOLIC PANEL  CBC      IMPRESSION / MDM / [place] / ED COURSE  I reviewed the triage vital signs and the nursing notes.                              Differential diagnosis includes, but is not limited to, essential hypertension, electrolyte or metabolic abnormality, acute infection, hypertensive urgency/emergency, renal dysfunction, anxiety.  Patient's presentation is most consistent with acute presentation with potential threat to life or bodily function.  However the patient's evaluation has been reassuring.  Her blood pressure came down without treatment, all the way to 136/83.  At no point did she have any chest pain and there is no indication for troponin or EKG.  I ordered basic metabolic panel and CBC, and both lab tests are completely within normal limits.  I provided reassurance and explained to the patient that  I would consider hospitalization if her blood pressure was persistently severely elevated and she was showing systemic signs of illness, such as altered mental status or chest pain, but given that she is asymptomatic and her blood pressure came down on its own, she can follow-up as an outpatient with Dr. Danise Mina.  She is comfortable with this plan.  I gave my usual and customary follow-up recommendations and return precautions.       FINAL CLINICAL IMPRESSION(S) / ED DIAGNOSES   Final diagnoses:  Hypertension, unspecified type     Rx / DC Orders   ED Discharge Orders     None        Note:  This document was prepared using Dragon voice recognition software and may include unintentional dictation errors.   Hinda Kehr, MD 10/01/21 [phone]

## 2021-10-01 NOTE — Discharge Instructions (Signed)

## 2021-10-02 ENCOUNTER — Encounter: Payer: Self-pay | Admitting: Internal Medicine

## 2021-10-02 ENCOUNTER — Ambulatory Visit (INDEPENDENT_AMBULATORY_CARE_PROVIDER_SITE_OTHER): Payer: Commercial Managed Care - HMO | Admitting: Internal Medicine

## 2021-10-02 DIAGNOSIS — R030 Elevated blood-pressure reading, without diagnosis of hypertension: Secondary | ICD-10-CM | POA: Diagnosis not present

## 2022-02-23 ENCOUNTER — Encounter: Payer: Self-pay | Admitting: Family Medicine

## 2022-02-23 ENCOUNTER — Ambulatory Visit (INDEPENDENT_AMBULATORY_CARE_PROVIDER_SITE_OTHER): Payer: Commercial Managed Care - HMO | Admitting: Family Medicine

## 2022-02-23 VITALS — BP 122/76 | HR 83 | Temp 97.9°F | Ht 67.75 in | Wt 244.2 lb

## 2022-02-23 DIAGNOSIS — E559 Vitamin D deficiency, unspecified: Secondary | ICD-10-CM | POA: Diagnosis not present

## 2022-02-23 DIAGNOSIS — R002 Palpitations: Secondary | ICD-10-CM | POA: Diagnosis not present

## 2022-02-23 DIAGNOSIS — Z23 Encounter for immunization: Secondary | ICD-10-CM

## 2022-02-23 DIAGNOSIS — E7800 Pure hypercholesterolemia, unspecified: Secondary | ICD-10-CM | POA: Diagnosis not present

## 2022-02-23 LAB — TSH: TSH: 1.45 u[IU]/mL (ref 0.35–5.50)

## 2022-02-23 LAB — LIPID PANEL
Cholesterol: 221 mg/dL — ABNORMAL HIGH (ref 0–200)
HDL: 56.4 mg/dL (ref >39.00)
LDL Cholesterol: 146 mg/dL — ABNORMAL HIGH (ref 0–99)
NonHDL: 165.06
Total CHOL/HDL Ratio: 4
Triglycerides: 96 mg/dL (ref 0.0–149.0)
VLDL: 19.2 mg/dL (ref 0.0–40.0)

## 2022-02-23 LAB — COMPREHENSIVE METABOLIC PANEL
ALT: 11 U/L (ref 0–35)
AST: 16 U/L (ref 0–37)
Albumin: 4.4 g/dL (ref 3.5–5.2)
Alkaline Phosphatase: 129 U/L — ABNORMAL HIGH (ref 39–117)
BUN: 7 mg/dL (ref 6–23)
CO2: 31 mEq/L (ref 19–32)
Calcium: 9.7 mg/dL (ref 8.4–10.5)
Chloride: 101 mEq/L (ref 96–112)
Creatinine, Ser: 0.8 mg/dL (ref 0.40–1.20)
GFR: 81.04 mL/min (ref >60.00)
Glucose, Bld: 87 mg/dL (ref 70–99)
Potassium: 4.4 mEq/L (ref 3.5–5.1)
Sodium: 138 mEq/L (ref 135–145)
Total Bilirubin: 0.5 mg/dL (ref 0.2–1.2)
Total Protein: 8.4 g/dL — ABNORMAL HIGH (ref 6.0–8.3)

## 2022-02-23 LAB — CBC WITH DIFFERENTIAL/PLATELET
Basophils Absolute: 0 10*3/uL (ref 0.0–0.1)
Basophils Relative: 0.9 % (ref 0.0–3.0)
Eosinophils Absolute: 0 10*3/uL (ref 0.0–0.7)
Eosinophils Relative: 0.3 % (ref 0.0–5.0)
HCT: 39.4 % (ref 36.0–46.0)
Hemoglobin: 13 g/dL (ref 12.0–15.0)
Lymphocytes Relative: 31.4 % (ref 12.0–46.0)
Lymphs Abs: 1.5 10*3/uL (ref 0.7–4.0)
MCHC: 33 g/dL (ref 30.0–36.0)
MCV: 90.2 fl (ref 78.0–100.0)
Monocytes Absolute: 0.4 10*3/uL (ref 0.1–1.0)
Monocytes Relative: 9.2 % (ref 3.0–12.0)
Neutro Abs: 2.8 10*3/uL (ref 1.4–7.7)
Neutrophils Relative %: 58.2 % (ref 43.0–77.0)
Platelets: 277 10*3/uL (ref 150.0–400.0)
RBC: 4.37 Mil/uL (ref 3.87–5.11)
RDW: 12.7 % (ref 11.5–15.5)
WBC: 4.8 10*3/uL (ref 4.0–10.5)

## 2022-02-23 MED ORDER — VITAMIN D3 1.25 MG (50000 UT) PO TABS: 1.0000 | ORAL_TABLET | ORAL | 3 refills | Status: DC

## 2022-02-23 NOTE — Assessment & Plan Note (Addendum)
She's not been taking daily replacement. Requests weekly Rx dosing - sent to pharmacy.

## 2022-02-23 NOTE — Assessment & Plan Note (Signed)
Update FLP as fasting.

## 2022-02-23 NOTE — Assessment & Plan Note (Addendum)
Episode last week of palpitations after brisk walk that persisted for several hours - anticipate due to deconditioning after recent weight gain.  Check labwork including CBC, CMP, TSH to r/o other causes of palpitations.  Encouraged incorporating regular aerobic exercise into routine and slowly building up until she reaches AHA-recommended 150mn/wk mod intensity aerobic exercise.  Update if new or worsening symptoms to consider Zio patch.

## 2022-02-23 NOTE — Patient Instructions (Addendum)
Flu shot today  EKG today  Labs today  Episode possibly from deconditioning.  Work on regular exercise routine, aerobic.  AHA recommends 150 min of moderate intensity aerobic exercise weekly. Start slowly 10-15 min at a time and build up to 30 min 5x/wk or 1 hour 3x/wk.  Let me know if ongoing symptoms or new chest discomfort, shortness of breath, dizziness.

## 2022-02-23 NOTE — Progress Notes (Signed)
Patient ID: Breanna Weber, female    DOB: 1965/08/03, 56 y.o.   MRN: [id number]  This visit was conducted in person.  BP 122/76   Pulse 83   Temp 97.9 F (36.6 C) (Temporal)   Ht 5' 7.75" (1.721 m)   Wt 244 lb 3.2 oz (110.8 kg)   LMP 07/29/2015   SpO2 97%   BMI 37.41 kg/m   BP Readings from Last 3 Encounters:  02/23/22 122/76  10/02/21 116/82  10/01/21 136/83    CC: discuss anxiety  Subjective:   HPI: Breanna Weber is a 56 y.o. female presenting on 02/23/2022 for Anxiety (C/o feeling anxious. Occasionally has trouble bringing HR down, especially recently after taking a brisk walk. )   Last week had to walk faster for 1/4 mile - and felt heart racing for significant period of time after this.   BP at home largely well controlled.   Notes occasional leg swelling with prolonged sitting/standing.  No dyspnea, chest pain, dizziness, syncope.  No fevers, cough, abd pain, nausea, HA.   Seen at ER 09/2021 with elevated BP readings.  Weight gain noted - from 212 lbs 03/2020.   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  Activity - no regular exercise routine. Diet - overall good diet.      Relevant past medical, surgical, family and social history reviewed and updated as indicated. Interim medical history since our last visit reviewed. Allergies and medications reviewed and updated. Outpatient Medications Prior to Visit  Medication Sig Dispense Refill   sulfamethoxazole-trimethoprim (BACTRIM) 400-80 MG tablet Take 1 tablet by mouth once as needed for up to 1 dose. After sex 30 tablet 1   Cholecalciferol (VITAMIN D) 50 MCG (2000 UT) CAPS Take 1 capsule (2,000 Units total) by mouth daily. (Patient not taking: Reported on 10/02/2021) 30 capsule    No facility-administered medications prior to visit.     Per HPI unless specifically indicated in ROS section below Review of Systems  Objective:  BP 122/76   Pulse 83   Temp  97.9 F (36.6 C) (Temporal)   Ht 5' 7.75" (1.721 m)   Wt 244 lb 3.2 oz (110.8 kg)   LMP 07/29/2015   SpO2 97%   BMI 37.41 kg/m   Wt Readings from Last 3 Encounters:  02/23/22 244 lb 3.2 oz (110.8 kg)  10/02/21 244 lb (110.7 kg)  09/30/21 220 lb (99.8 kg)      Physical Exam Vitals and nursing note reviewed.  Constitutional:      Appearance: Normal appearance. She is obese. She is not ill-appearing.  HENT:     Mouth/Throat:     Mouth: Mucous membranes are moist.     Pharynx: Oropharynx is clear. No oropharyngeal exudate or posterior oropharyngeal erythema.  Eyes:     Extraocular Movements: Extraocular movements intact.     Pupils: Pupils are equal, round, and reactive to light.  Neck:     Thyroid: No thyroid mass or thyromegaly.  Cardiovascular:     Rate and Rhythm: Normal rate and regular rhythm.     Pulses: Normal pulses.     Heart sounds: Normal heart sounds. No murmur heard. Pulmonary:     Effort: Pulmonary effort is normal. No respiratory distress.     Breath sounds: Normal breath sounds. No wheezing, rhonchi or rales.  Musculoskeletal:     Cervical back: Normal range of motion and neck supple.     Right lower leg:  No edema.     Left lower leg: No edema.  Skin:    General: Skin is warm and dry.     Findings: No rash.  Neurological:     Mental Status: She is alert.  Psychiatric:        Mood and Affect: Mood normal.        Behavior: Behavior normal.       Results for orders placed or performed during the hospital encounter of 85/63/14  Basic metabolic panel  Result Value Ref Range   Sodium 137 135 - 145 mmol/L   Potassium 3.5 3.5 - 5.1 mmol/L   Chloride 105 98 - 111 mmol/L   CO2 26 22 - 32 mmol/L   Glucose, Bld 90 70 - 99 mg/dL   BUN 11 6 - 20 mg/dL   Creatinine, Ser 0.68 0.44 - 1.00 mg/dL   Calcium 9.6 8.9 - 10.3 mg/dL   GFR, Estimated >60 >60 mL/min   Anion gap 6 5 - 15  CBC  Result Value Ref Range   WBC 5.6 4.0 - 10.5 K/uL   RBC 4.33 3.87 - 5.11  MIL/uL   Hemoglobin 12.5 12.0 - 15.0 g/dL   HCT 39.7 36.0 - 46.0 %   MCV 91.7 80.0 - 100.0 fL   MCH 28.9 26.0 - 34.0 pg   MCHC 31.5 30.0 - 36.0 g/dL   RDW 11.9 11.5 - 15.5 %   Platelets 268 150 - 400 K/uL   nRBC 0.0 0.0 - 0.2 %   Lab Results  Component Value Date   TSH 1.260 12/29/2020    EKG - NSR rate 60, normal axis, intervals, no hypertrophy or acute ST/T changes   Assessment & Plan:   Problem List Items Addressed This Visit     Severe obesity (BMI 35.0-39.9) with comorbidity ([place])    Encouraged healthy diet and lifestyle choices to affect sustainable weight loss.  Discussed option of bariatric medications such as weekly Wegovy or Zepbound. She prefers to work on [employer].       Vitamin D deficiency    She's not been taking daily replacement. Requests weekly Rx dosing - sent to pharmacy.       Elevated LDL cholesterol level    Update FLP as fasting.       Relevant Orders   Lipid panel   Palpitation - Primary    Episode last week of palpitations after brisk walk that persisted for several hours - anticipate due to deconditioning after recent weight gain.  Check labwork including CBC, CMP, TSH to r/o other causes of palpitations.  Encouraged incorporating regular aerobic exercise into routine and slowly building up until she reaches AHA-recommended 146mn/wk mod intensity aerobic exercise.  Update if new or worsening symptoms to consider Zio patch.       Relevant Orders   TSH   Comprehensive metabolic panel   CBC with Differential/Platelet   EKG 12-Lead (Completed)   Other Visit Diagnoses     Need for influenza vaccination       Relevant Orders   Flu Vaccine QUAD 622moM (Fluarix, Fluzone & Alfiuria Quad PF) (Completed)        Meds ordered this encounter  Medications   Cholecalciferol (VITAMIN D3) 1.25 MG (50000 UT) TABS    Sig: Take 1 tablet by mouth once a week.    Dispense:  12 tablet    Refill:  3   Orders Placed This Encounter   Procedures   Flu Vaccine QUAD  29moIM (Fluarix, Fluzone & Alfiuria Quad PF)   TSH   Comprehensive metabolic panel   CBC with Differential/Platelet   Lipid panel   EKG 12-Lead    Patient Instructions  Flu shot today  EKG today  Labs today  Episode possibly from deconditioning.  Work on regular exercise routine, aerobic.  AHA recommends 150 min of moderate intensity aerobic exercise weekly. Start slowly 10-15 min at a time and build up to 30 min 5x/wk or 1 hour 3x/wk.  Let me know if ongoing symptoms or new chest discomfort, shortness of breath, dizziness.   Follow up plan: Return if symptoms worsen or fail to improve.  JRia Bush MD

## 2022-02-23 NOTE — Assessment & Plan Note (Addendum)
Encouraged healthy diet and lifestyle choices to affect sustainable weight loss.  Discussed option of bariatric medications such as weekly Wegovy or Zepbound. She prefers to work on [employer].

## 2022-02-25 ENCOUNTER — Other Ambulatory Visit: Payer: Self-pay | Admitting: Family Medicine

## 2022-02-25 ENCOUNTER — Encounter: Payer: Self-pay | Admitting: *Deleted

## 2022-02-25 ENCOUNTER — Encounter: Payer: Self-pay | Admitting: Family Medicine

## 2022-02-25 DIAGNOSIS — R779 Abnormality of plasma protein, unspecified: Secondary | ICD-10-CM

## 2022-02-25 DIAGNOSIS — E7800 Pure hypercholesterolemia, unspecified: Secondary | ICD-10-CM

## 2022-02-25 DIAGNOSIS — R748 Abnormal levels of other serum enzymes: Secondary | ICD-10-CM

## 2022-02-25 DIAGNOSIS — E559 Vitamin D deficiency, unspecified: Secondary | ICD-10-CM

## 2022-02-25 DIAGNOSIS — E785 Hyperlipidemia, unspecified: Secondary | ICD-10-CM

## 2022-02-25 NOTE — Addendum Note (Signed)
Addended by: Ria Bush on: 02/25/2022 11:30 AM   Modules accepted: Orders

## 2022-03-08 ENCOUNTER — Ambulatory Visit
Admission: RE | Admit: 2022-03-08 | Discharge: 2022-03-08 | Disposition: A | Discharge status: Home / Self Care | Payer: Commercial Managed Care - HMO | Source: Ambulatory Visit | Attending: Family Medicine | Admitting: Family Medicine

## 2022-03-08 DIAGNOSIS — R748 Abnormal levels of other serum enzymes: Secondary | ICD-10-CM

## 2022-03-09 ENCOUNTER — Encounter: Payer: Self-pay | Admitting: Family Medicine

## 2022-03-09 DIAGNOSIS — K760 Fatty (change of) liver, not elsewhere classified: Secondary | ICD-10-CM | POA: Insufficient documentation

## 2022-03-09 DIAGNOSIS — K8020 Calculus of gallbladder without cholecystitis without obstruction: Secondary | ICD-10-CM | POA: Insufficient documentation

## 2022-05-19 ENCOUNTER — Other Ambulatory Visit (INDEPENDENT_AMBULATORY_CARE_PROVIDER_SITE_OTHER): Payer: BC Managed Care – PPO

## 2022-05-19 DIAGNOSIS — E7800 Pure hypercholesterolemia, unspecified: Secondary | ICD-10-CM

## 2022-05-19 DIAGNOSIS — E559 Vitamin D deficiency, unspecified: Secondary | ICD-10-CM | POA: Diagnosis not present

## 2022-05-19 DIAGNOSIS — E785 Hyperlipidemia, unspecified: Secondary | ICD-10-CM | POA: Diagnosis not present

## 2022-05-19 DIAGNOSIS — R779 Abnormality of plasma protein, unspecified: Secondary | ICD-10-CM

## 2022-05-19 DIAGNOSIS — R748 Abnormal levels of other serum enzymes: Secondary | ICD-10-CM

## 2022-05-19 LAB — COMPREHENSIVE METABOLIC PANEL
ALT: 11 U/L (ref 0–35)
AST: 17 U/L (ref 0–37)
Albumin: 4.3 g/dL (ref 3.5–5.2)
Alkaline Phosphatase: 108 U/L (ref 39–117)
BUN: 14 mg/dL (ref 6–23)
CO2: 27 mEq/L (ref 19–32)
Calcium: 9.4 mg/dL (ref 8.4–10.5)
Chloride: 103 mEq/L (ref 96–112)
Creatinine, Ser: 0.79 mg/dL (ref 0.40–1.20)
GFR: 82.14 mL/min (ref >60.00)
Glucose, Bld: 87 mg/dL (ref 70–99)
Potassium: 4.1 mEq/L (ref 3.5–5.1)
Sodium: 138 mEq/L (ref 135–145)
Total Bilirubin: 0.5 mg/dL (ref 0.2–1.2)
Total Protein: 8.4 g/dL — ABNORMAL HIGH (ref 6.0–8.3)

## 2022-05-19 LAB — LIPID PANEL
Cholesterol: 205 mg/dL — ABNORMAL HIGH (ref 0–200)
HDL: 57.3 mg/dL (ref >39.00)
LDL Cholesterol: 129 mg/dL — ABNORMAL HIGH (ref 0–99)
NonHDL: 147.91
Total CHOL/HDL Ratio: 4
Triglycerides: 93 mg/dL (ref 0.0–149.0)
VLDL: 18.6 mg/dL (ref 0.0–40.0)

## 2022-05-19 LAB — IBC PANEL
Iron: 100 ug/dL (ref 42–145)
Saturation Ratios: 28.2 % (ref 20.0–50.0)
TIBC: 354.2 ug/dL (ref 250.0–450.0)
Transferrin: 253 mg/dL (ref 212.0–360.0)

## 2022-05-19 LAB — SEDIMENTATION RATE: Sed Rate: 48 mm/hr — ABNORMAL HIGH (ref 0–30)

## 2022-05-19 LAB — VITAMIN D 25 HYDROXY (VIT D DEFICIENCY, FRACTURES): VITD: 45.63 ng/mL (ref 30.00–100.00)

## 2022-05-20 LAB — APOLIPOPROTEIN B: Apolipoprotein B: 96 mg/dL — ABNORMAL HIGH (ref <90)

## 2022-05-22 LAB — PROTEIN ELECTROPHORESIS, SERUM, WITH REFLEX
Albumin ELP: 4 g/dL (ref 3.8–4.8)
Alpha 1: 0.2 g/dL (ref 0.2–0.3)
Alpha 2: 0.6 g/dL (ref 0.5–0.9)
Beta 2: 0.8 g/dL — ABNORMAL HIGH (ref 0.2–0.5)
Beta Globulin: 0.5 g/dL (ref 0.4–0.6)
Gamma Globulin: 1.9 g/dL — ABNORMAL HIGH (ref 0.8–1.7)
Total Protein: 8 g/dL (ref 6.1–8.1)

## 2022-05-25 ENCOUNTER — Encounter: Payer: Self-pay | Admitting: Family Medicine

## 2022-05-25 ENCOUNTER — Ambulatory Visit (INDEPENDENT_AMBULATORY_CARE_PROVIDER_SITE_OTHER): Payer: BC Managed Care – PPO | Admitting: Family Medicine

## 2022-05-25 VITALS — BP 136/82 | HR 80 | Temp 97.3°F | Ht 67.5 in | Wt 243.1 lb

## 2022-05-25 DIAGNOSIS — R748 Abnormal levels of other serum enzymes: Secondary | ICD-10-CM

## 2022-05-25 DIAGNOSIS — Z0000 Encounter for general adult medical examination without abnormal findings: Secondary | ICD-10-CM

## 2022-05-25 DIAGNOSIS — R700 Elevated erythrocyte sedimentation rate: Secondary | ICD-10-CM

## 2022-05-25 DIAGNOSIS — Z23 Encounter for immunization: Secondary | ICD-10-CM | POA: Diagnosis not present

## 2022-05-25 DIAGNOSIS — E785 Hyperlipidemia, unspecified: Secondary | ICD-10-CM

## 2022-05-25 DIAGNOSIS — E559 Vitamin D deficiency, unspecified: Secondary | ICD-10-CM

## 2022-05-25 DIAGNOSIS — R779 Abnormality of plasma protein, unspecified: Secondary | ICD-10-CM

## 2022-05-25 DIAGNOSIS — K760 Fatty (change of) liver, not elsewhere classified: Secondary | ICD-10-CM

## 2022-05-25 MED ORDER — VITAMIN D3 1.25 MG (50000 UT) PO TABS: 1.0000 | ORAL_TABLET | ORAL | 4 refills | Status: DC

## 2022-05-25 NOTE — Assessment & Plan Note (Signed)
Chronic, mildly elevated off medication. Continue healthy diet and lifestyle to keep lipid levels unde control. The 10-year ASCVD risk score (Arnett DK, et al., 2019) is: 3%   Values used to calculate the score:     Age: 57 years     Sex: Female     Is Non-Hispanic African American: No     Diabetic: No     Tobacco smoker: No     Systolic Blood Pressure: [id number] mmHg     Is BP treated: No     HDL Cholesterol: 57.3 mg/dL     Total Cholesterol: 205 mg/dL

## 2022-05-25 NOTE — Assessment & Plan Note (Signed)
Preventative protocols reviewed and updated unless pt declined. Discussed healthy diet and lifestyle.  

## 2022-05-25 NOTE — Assessment & Plan Note (Addendum)
Remains mildly elevated - ?arthritis related, will continue to montior.

## 2022-05-25 NOTE — Assessment & Plan Note (Signed)
Minimal. SPEP overall reassuring

## 2022-05-25 NOTE — Progress Notes (Signed)
Patient ID: Breanna Weber, female    DOB: 05-Mar-1966, 57 y.o.   MRN: [id number]  This visit was conducted in person.  BP 136/82   Pulse 80   Temp (!) 97.3 F (36.3 C) (Temporal)   Ht 5' 7.5" (1.715 m)   Wt 243 lb 2 oz (110.3 kg)   LMP 07/29/2015   SpO2 97%   BMI 37.52 kg/m    CC: CPE Subjective:   HPI: Breanna Weber is a 57 y.o. female presenting on 05/25/2022 for Annual Exam   Preventative: COLONOSCOPY WITH PROPOFOL 04/10/2019 - HP, diverticulosis, rpt 10 yrs Bonna Gains, Lennette Bihari, MD) Well woman exam with OBGYN Dr Ilda Basset, last seen 12/2020 with normal pap smear. H/o fibroids.  LMP: 07/2014 Mammogram 01/2021 - Birads1 @ [place]  Lung cancer screening - not eligible  Flu shot - yearly [place] 06/2019, 07/2019  Tetanus - 2004 Shingrix - 03/2021 Seat belt use discussed  Sunscreen use discussed. No changing moles on skin.  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 Interim medical history since our last visit reviewed. Allergies and medications reviewed and updated. Outpatient Medications Prior to Visit  Medication Sig Dispense Refill   sulfamethoxazole-trimethoprim (BACTRIM) 400-80 MG tablet Take 1 tablet by mouth once as needed for up to 1 dose. After sex 30 tablet 1   Cholecalciferol (VITAMIN D3) 1.25 MG (50000 UT) TABS Take 1 tablet by mouth once a week. 12 tablet 3   No facility-administered medications prior to visit.     Per HPI unless specifically indicated in ROS section below Review of  Systems  Constitutional:  Negative for activity change, appetite change, chills, fatigue, fever and unexpected weight change.  HENT:  Negative for hearing loss.   Eyes:  Negative for visual disturbance.  Respiratory:  Negative for cough, chest tightness, shortness of breath and wheezing.   Cardiovascular:  Negative for chest pain, palpitations and leg swelling.  Gastrointestinal:  Negative for abdominal distention, abdominal pain, blood in stool, constipation, diarrhea, nausea and vomiting.  Genitourinary:  Negative for difficulty urinating and hematuria.  Musculoskeletal:  Negative for arthralgias, myalgias and neck pain.  Skin:  Negative for rash.  Neurological:  Negative for dizziness, seizures, syncope and headaches.  Hematological:  Negative for adenopathy. Does not bruise/bleed easily.  Psychiatric/Behavioral:  Negative for dysphoric mood. The patient is not nervous/anxious.     Objective:  BP 136/82   Pulse 80   Temp (!) 97.3 F (36.3 C) (Temporal)   Ht 5' 7.5" (1.715 m)   Wt 243 lb 2 oz (110.3 kg)   LMP 07/29/2015   SpO2 97%   BMI 37.52 kg/m   Wt Readings from Last 3 Encounters:  05/25/22 243 lb 2 oz (110.3 kg)  02/23/22 244 lb 3.2 oz (110.8 kg)  10/02/21 244 lb (110.7 kg)      Physical Exam Vitals and nursing note reviewed.  Constitutional:      Appearance: Normal appearance. She is not ill-appearing.  HENT:     Head: Normocephalic and atraumatic.  Right Ear: Tympanic membrane, ear canal and external ear normal. There is no impacted cerumen.     Left Ear: Tympanic membrane, ear canal and external ear normal. There is no impacted cerumen.     Mouth/Throat:     Comments: Wearing mask Eyes:     General:        Right eye: No discharge.        Left eye: No discharge.     Extraocular Movements: Extraocular movements intact.     Conjunctiva/sclera: Conjunctivae normal.     Pupils: Pupils are equal, round, and reactive to light.  Neck:     Thyroid: No thyroid  mass or thyromegaly.  Cardiovascular:     Rate and Rhythm: Normal rate and regular rhythm.     Pulses: Normal pulses.     Heart sounds: Normal heart sounds. No murmur heard. Pulmonary:     Effort: Pulmonary effort is normal. No respiratory distress.     Breath sounds: Normal breath sounds. No wheezing, rhonchi or rales.  Abdominal:     General: Bowel sounds are normal. There is no distension.     Palpations: Abdomen is soft. There is no mass.     Tenderness: There is no abdominal tenderness. There is no guarding or rebound.     Hernia: No hernia is present.  Musculoskeletal:     Cervical back: Normal range of motion and neck supple. No rigidity.     Right lower leg: No edema.     Left lower leg: No edema.  Lymphadenopathy:     Cervical: No cervical adenopathy.  Skin:    General: Skin is warm and dry.     Findings: No rash.  Neurological:     General: No focal deficit present.     Mental Status: She is alert. Mental status is at baseline.  Psychiatric:        Mood and Affect: Mood normal.        Behavior: Behavior normal.       Results for orders placed or performed in visit on 05/19/22  Apolipoprotein B  Result Value Ref Range   Apolipoprotein B 96 (H) <90 mg/dL  IBC panel  Result Value Ref Range   Iron 100 42 - 145 ug/dL   Transferrin 253.0 212.0 - 360.0 mg/dL   Saturation Ratios 28.2 20.0 - 50.0 %   TIBC 354.2 250.0 - 450.0 mcg/dL  Sedimentation rate  Result Value Ref Range   Sed Rate 48 (H) 0 - 30 mm/hr  Serum protein electrophoresis with reflex  Result Value Ref Range   Total Protein 8.0 6.1 - 8.1 g/dL   Albumin ELP 4.0 3.8 - 4.8 g/dL   Alpha 1 0.2 0.2 - 0.3 g/dL   Alpha 2 0.6 0.5 - 0.9 g/dL   Beta Globulin 0.5 0.4 - 0.6 g/dL   Beta 2 0.8 (H) 0.2 - 0.5 g/dL   Gamma Globulin 1.9 (H) 0.8 - 1.7 g/dL   SPE Interp.    Comprehensive metabolic panel  Result Value Ref Range   Sodium 138 135 - 145 mEq/L   Potassium 4.1 3.5 - 5.1 mEq/L   Chloride 103 96 - 112 mEq/L    CO2 27 19 - 32 mEq/L   Glucose, Bld 87 70 - 99 mg/dL   BUN 14 6 - 23 mg/dL   Creatinine, Ser 0.79 0.40 - 1.20 mg/dL   Total Bilirubin 0.5 0.2 - 1.2 mg/dL   Alkaline Phosphatase 108 39 - 117 U/L  AST 17 0 - 37 U/L   ALT 11 0 - 35 U/L   Total Protein 8.4 (H) 6.0 - 8.3 g/dL   Albumin 4.3 3.5 - 5.2 g/dL   GFR 82.14 >60.00 mL/min   Calcium 9.4 8.4 - 10.5 mg/dL  Lipid panel  Result Value Ref Range   Cholesterol 205 (H) 0 - 200 mg/dL   Triglycerides 93.0 0.0 - 149.0 mg/dL   HDL 57.30 >39.00 mg/dL   VLDL 18.6 0.0 - 40.0 mg/dL   LDL Cholesterol 129 (H) 0 - 99 mg/dL   Total CHOL/HDL Ratio 4    NonHDL 147.91   VITAMIN D 25 Hydroxy (Vit-D Deficiency, Fractures)  Result Value Ref Range   VITD 45.63 30.00 - 100.00 ng/mL    Assessment & Plan:   Problem List Items Addressed This Visit     Healthcare maintenance - Primary (Chronic)    Preventative protocols reviewed and updated unless pt declined. Discussed healthy diet and lifestyle.       Severe obesity (BMI 35.0-39.9) with comorbidity ([place])    Continue to encourage health diet and lifestyle choices to affect sustainable weight loss.       HLD (hyperlipidemia)    Chronic, mildly elevated off medication. Continue healthy diet and lifestyle to keep lipid levels unde control. The 10-year ASCVD risk score (Arnett DK, et al., 2019) is: 3%   Values used to calculate the score:     Age: 58 years     Sex: Female     Is Non-Hispanic African American: No     Diabetic: No     Tobacco smoker: No     Systolic Blood Pressure: [id number] mmHg     Is BP treated: No     HDL Cholesterol: 57.3 mg/dL     Total Cholesterol: 205 mg/dL       Elevated sedimentation rate    Remains mildly elevated - ?arthritis related, will continue to montior.       Vitamin D deficiency    Doing well on weekly replacement - will continue this.  She remembers to take better than daily replacement dosing.       Elevated blood protein    Minimal. SPEP overall  reassuring      Elevated alkaline phosphatase level    This is better with healthier diet ?fatty liver related.       NAFLD (nonalcoholic fatty liver disease)   Other Visit Diagnoses     Need for shingles vaccine       Relevant Orders   Varicella-zoster vaccine IM (Completed)        Meds ordered this encounter  Medications   Cholecalciferol (VITAMIN D3) 1.25 MG (50000 UT) TABS    Sig: Take 1 tablet by mouth once a week.    Dispense:  12 tablet    Refill:  4    Orders Placed This Encounter  Procedures   Varicella-zoster vaccine IM    Patient Instructions  Call to schedule mammogram at your convenience: [facility] at [facility] [phone] 2nd and final shingrix vaccine today.  You are doing well today - continue healthy diet and restart walking routine.  Return as needed or in 1 year for next physical.   Follow up plan: Return in about 1 year (around 05/26/2023) for annual exam, prior fasting for blood work.  Ria Bush, MD

## 2022-05-25 NOTE — Patient Instructions (Addendum)
Call to schedule mammogram at your convenience: [facility] at [facility] [phone] 2nd and final shingrix vaccine today.  You are doing well today - continue healthy diet and restart walking routine.  Return as needed or in 1 year for next physical.

## 2022-05-25 NOTE — Assessment & Plan Note (Addendum)
Continue to encourage health diet and lifestyle choices to affect sustainable weight loss.

## 2022-05-25 NOTE — Assessment & Plan Note (Signed)
Doing well on weekly replacement - will continue this.  She remembers to take better than daily replacement dosing.

## 2022-05-25 NOTE — Assessment & Plan Note (Addendum)
This is better with healthier diet ?fatty liver related.

## 2023-04-19 ENCOUNTER — Ambulatory Visit: Payer: 59 | Attending: Family Medicine

## 2023-04-19 ENCOUNTER — Encounter: Payer: Self-pay | Admitting: Family Medicine

## 2023-04-19 ENCOUNTER — Ambulatory Visit (INDEPENDENT_AMBULATORY_CARE_PROVIDER_SITE_OTHER): Payer: Self-pay | Admitting: Family Medicine

## 2023-04-19 VITALS — BP 130/84 | HR 78 | Temp 98.8°F | Ht 67.5 in | Wt 254.5 lb

## 2023-04-19 DIAGNOSIS — I480 Paroxysmal atrial fibrillation: Secondary | ICD-10-CM

## 2023-04-19 DIAGNOSIS — Z23 Encounter for immunization: Secondary | ICD-10-CM | POA: Diagnosis not present

## 2023-04-19 DIAGNOSIS — R400 Somnolence: Secondary | ICD-10-CM | POA: Diagnosis not present

## 2023-04-19 DIAGNOSIS — R779 Abnormality of plasma protein, unspecified: Secondary | ICD-10-CM | POA: Diagnosis not present

## 2023-04-19 MED ORDER — METOPROLOL TARTRATE 25 MG PO TABS: 25.0000 mg | ORAL_TABLET | Freq: Two times a day (BID) | ORAL | 3 refills | Status: DC

## 2023-04-19 NOTE — Assessment & Plan Note (Signed)
 Encourage weight loss to help decrease risk of afib.

## 2023-04-19 NOTE — Progress Notes (Signed)
 Ph: [phone] Fax: [phone]   Patient ID: Breanna Weber, female    DOB: 05-21-1965, 58 y.o.   MRN: [id number]  This visit was conducted in person.  BP 130/84   Pulse 78   Temp 98.8 F (37.1 C) (Oral)   Ht 5' 7.5 (1.715 m)   Wt 254 lb 8 oz (115.4 kg)   LMP 07/29/2015   SpO2 99%   BMI 39.27 kg/m   BP Readings from Last 3 Encounters:  04/19/23 130/84  05/25/22 136/82  02/23/22 122/76   CC: ER f/u visit  Subjective:   HPI: Breanna Weber is a 58 y.o. female presenting on 04/19/2023 for Hospitalization Follow-up (Seen on 04/06/24 at [facility] ED [[facility]], dx paroxymal afib; RSV. )   Recent ER visit at [facility] for palpitations, found to have atrial fibrillation with RVR. Treated with diltiazem IV followed by metoprolol  25mg  tablet with resolution of symptoms, discharged on metoprolol  25mg  bid, started aspirin 81mg  daily. At discharge was back in NSR. ER records reviewed. RSV positive. Labs reassuring including CMP (Cr 0.87, GFR 77, ALP 107), Tprot was elevated at 9, CBC, D dimer, Mg, TnI x2, UA. TSH 1.75. 1 view CXR - mild cardiomegaly, low lung volumes. Unable to review actual EKG strip but read as afib with RVR.   Ate [employer] chicken sandwich and vomited - then palpitations started.  Since back home she's been feeling much better.   Episode 02/2022 of palpitations after brisk walk, labs and EKG at that time unrevealing.   She's had intermittent weekly palpitations over the past year, but notes since starting metoprolol  25mg  BID she's not had any more palpitations.   Sleep - trouble with restful sleep. Overall restorative sleep. No known snoring, witnessed apnea, PNdyspnea, no morning headaches. No significant daytime sleepiness.      Relevant past medical, surgical, family and social history reviewed and updated as indicated. Interim medical history since our last visit reviewed. Allergies and medications reviewed and updated. Outpatient  Medications Prior to Visit  Medication Sig Dispense Refill   Cholecalciferol (VITAMIN D3) 1.25 MG (50000 UT) TABS Take 1 tablet by mouth once a week. 12 tablet 4   sulfamethoxazole -trimethoprim  (BACTRIM ) 400-80 MG tablet Take 1 tablet by mouth once as needed for up to 1 dose. After sex 30 tablet 1   metoprolol  tartrate (LOPRESSOR ) 25 MG tablet Take by mouth.     No facility-administered medications prior to visit.     Per HPI unless specifically indicated in ROS section below Review of Systems  Objective:  BP 130/84   Pulse 78   Temp 98.8 F (37.1 C) (Oral)   Ht 5' 7.5 (1.715 m)   Wt 254 lb 8 oz (115.4 kg)   LMP 07/29/2015   SpO2 99%   BMI 39.27 kg/m   Wt Readings from Last 3 Encounters:  04/19/23 254 lb 8 oz (115.4 kg)  05/25/22 243 lb 2 oz (110.3 kg)  02/23/22 244 lb 3.2 oz (110.8 kg)      Physical Exam Vitals and nursing note reviewed.  Constitutional:      Appearance: Normal appearance. She is not ill-appearing.  HENT:     Head: Normocephalic and atraumatic.     Mouth/Throat:     Mouth: Mucous membranes are moist.     Pharynx: Oropharynx is clear. No oropharyngeal exudate or posterior oropharyngeal erythema.  Eyes:     Pupils: Pupils are equal, round, and reactive to light.  Neck:     Thyroid :  No thyroid  mass or thyromegaly.  Cardiovascular:     Rate and Rhythm: Normal rate and regular rhythm.     Pulses: Normal pulses.     Heart sounds: Normal heart sounds. No murmur heard. Pulmonary:     Effort: Pulmonary effort is normal. No respiratory distress.     Breath sounds: Normal breath sounds. No wheezing, rhonchi or rales.  Musculoskeletal:     Cervical back: Normal range of motion and neck supple.     Right lower leg: No edema.     Left lower leg: No edema.  Skin:    General: Skin is warm and dry.     Findings: No rash.  Neurological:     Mental Status: She is alert.  Psychiatric:        Mood and Affect: Mood normal.        Behavior: Behavior normal.        EKG today - NSR rate 60, normal axis, intervals, no hypertrophy or acute ST/T changes  Assessment & Plan:   Problem List Items Addressed This Visit     Severe obesity (BMI 35.0-39.9) with comorbidity (HCC)   Encourage weight loss to help decrease risk of afib.       Elevated blood protein   Again elevated at ER, however SPEP without M spike 05/2022.  Will further eval at CPE next month.       Paroxysmal atrial fibrillation (HCC) - Primary   New diagnosis at [facility] ER in setting of RSV bronchitis and vomiting x1. She does endorse previous palpitations over the past 1-2 yrs, but denies recurrent palpitations since starting metoprolol  25mg  BID - will continue this.  CHADSVASC2 score = 1 (0.6% stroke risk/yr) - will hold anticoagulation at this time.  Recent labs reassuring including TSH, Ddimer.  Further eval with Zio patch, echocardiogram. Discussed cardiology eval option, will defer for now.  Reassess at CPE later this year.  ESS = 7, negative screen however will go ahead and order HST with new PAF diagnosis.       Relevant Medications   metoprolol  tartrate (LOPRESSOR ) 25 MG tablet   Other Relevant Orders   LONG TERM MONITOR (3-14 DAYS)   ECHOCARDIOGRAM COMPLETE   EKG 12-Lead (Completed)   Daytime somnolence   ESS = 7. New afib. Will order HST through [facility].       Other Visit Diagnoses       Encounter for immunization       Relevant Orders   Flu vaccine trivalent PF, 6mos and older(Flulaval,Afluria,Fluarix,Fluzone) (Completed)        Meds ordered this encounter  Medications   metoprolol  tartrate (LOPRESSOR ) 25 MG tablet    Sig: Take 1 tablet (25 mg total) by mouth 2 (two) times daily.    Dispense:  180 tablet    Refill:  3    Orders Placed This Encounter  Procedures   Flu vaccine trivalent PF, 6mos and older(Flulaval,Afluria,Fluarix,Fluzone)   LONG TERM MONITOR (3-14 DAYS)    Standing Status:   Future    Number of Occurrences:   1    Expiration  Date:   04/18/2024    Where should this test be performed?:   [facility]    Does the patient have an implanted cardiac device?:   No    Prescribed days of wear:   14    Type of enrollment:   Home Enrollment    Vendor::   [employer]   EKG 12-Lead   ECHOCARDIOGRAM COMPLETE  Standing Status:   Future    Expiration Date:   04/18/2024    Where should this test be performed:   [facility]    Perflutren DEFINITY (image enhancing agent) should be administered unless hypersensitivity or allergy exist:   Administer Perflutren    Reason for exam-Echo:   Atrial Fibrillation  I48.91    Patient Instructions  Flu shot today  Sleep questionnaire today  EKG today  [place]  I will order Zio patch to wear for 2 weeks.  We will set you up for home sleep test through [facility].  Continue metoprolol  25mg  twice daily.   Follow up plan: Return if symptoms worsen or fail to improve.  Anton Blas, MD

## 2023-04-19 NOTE — Assessment & Plan Note (Addendum)
 New diagnosis at [facility] ER in setting of RSV bronchitis and vomiting x1. She does endorse previous palpitations over the past 1-2 yrs, but denies recurrent palpitations since starting metoprolol  25mg  BID - will continue this.  CHADSVASC2 score = 1 (0.6% stroke risk/yr) - will hold anticoagulation at this time.  Recent labs reassuring including TSH, Ddimer.  Further eval with Zio patch, echocardiogram. Discussed cardiology eval option, will defer for now.  Reassess at CPE later this year.  ESS = 7, negative screen however will go ahead and order HST with new PAF diagnosis.

## 2023-04-19 NOTE — Assessment & Plan Note (Addendum)
 Again elevated at ER, however SPEP without M spike 05/2022.  Will further eval at CPE next month.

## 2023-04-19 NOTE — Patient Instructions (Addendum)
 Flu shot today  Sleep questionnaire today  EKG today  Will order heart ultrasound in [place].  I will order Zio patch to wear for 2 weeks.  We will set you up for home sleep test through [facility].  Continue metoprolol 25mg  twice daily.

## 2023-04-19 NOTE — Assessment & Plan Note (Signed)
 ESS = 7. New afib. Will order HST through [facility].

## 2023-04-22 ENCOUNTER — Emergency Department
Admission: EM | Admit: 2023-04-22 | Discharge: 2023-04-22 | Disposition: A | Discharge status: Home / Self Care | Payer: 59 | Attending: Emergency Medicine | Admitting: Emergency Medicine

## 2023-04-22 ENCOUNTER — Emergency Department: Payer: 59

## 2023-04-22 ENCOUNTER — Other Ambulatory Visit: Payer: Self-pay

## 2023-04-22 DIAGNOSIS — I10 Essential (primary) hypertension: Secondary | ICD-10-CM | POA: Insufficient documentation

## 2023-04-22 DIAGNOSIS — R002 Palpitations: Secondary | ICD-10-CM | POA: Insufficient documentation

## 2023-04-22 HISTORY — DX: Unspecified atrial fibrillation: I48.91

## 2023-04-22 LAB — CBC
HCT: 37.5 % (ref 36.0–46.0)
Hemoglobin: 12.3 g/dL (ref 12.0–15.0)
MCH: 29.4 pg (ref 26.0–34.0)
MCHC: 32.8 g/dL (ref 30.0–36.0)
MCV: 89.7 fL (ref 80.0–100.0)
Platelets: 290 10*3/uL (ref 150–400)
RBC: 4.18 MIL/uL (ref 3.87–5.11)
RDW: 11.8 % (ref 11.5–15.5)
WBC: 7.4 10*3/uL (ref 4.0–10.5)
nRBC: 0 % (ref 0.0–0.2)

## 2023-04-22 LAB — BASIC METABOLIC PANEL
Anion gap: 10 (ref 5–15)
BUN: 15 mg/dL (ref 6–20)
CO2: 24 mmol/L (ref 22–32)
Calcium: 9 mg/dL (ref 8.9–10.3)
Chloride: 104 mmol/L (ref 98–111)
Creatinine, Ser: 0.86 mg/dL (ref 0.44–1.00)
GFR, Estimated: >60 mL/min (ref >60)
Glucose, Bld: 113 mg/dL — ABNORMAL HIGH (ref 70–99)
Potassium: 3.4 mmol/L — ABNORMAL LOW (ref 3.5–5.1)
Sodium: 138 mmol/L (ref 135–145)

## 2023-04-22 LAB — TSH: TSH: 2.964 u[IU]/mL (ref 0.350–4.500)

## 2023-04-22 LAB — TROPONIN I (HIGH SENSITIVITY)
Troponin I (High Sensitivity): 3 ng/L (ref <18)
Troponin I (High Sensitivity): 3 ng/L (ref <18)

## 2023-04-22 NOTE — ED Provider Notes (Signed)
 [facility] Provider Note    Event Date/Time   First MD Initiated Contact with Patient 04/22/23 [phone]     (approximate)   History   Atrial Fibrillation   HPI  Breanna Weber is a 58 y.o. female past medical history significant for paroxysmal atrial fibrillation, hypertension, who presents to the emergency department with her palpitations.  States that she woke up with feeling like her heart was racing.  Felt like she was having some chest pressure.  Took her blood pressure and it was elevated.  States that she had a recent ED visit for similar episodes of heart palpitations.  Went to the emergency department and was found to be in atrial fibrillation with a rapid rate.  Converted back into normal sinus rhythm in the emergency department was started on p.o. metoprolol .  Just followed up with her primary care physician and has a referral in for cardiology.  Denies any active chest pain or shortness of breath.  No history of DVT or PE.     Physical Exam   Triage Vital Signs: ED Triage Vitals [04/22/23 0214]  Encounter Vitals Group     BP (!) 168/92     Systolic BP Percentile      Diastolic BP Percentile      Pulse Rate 86     Resp 18     Temp 98.3 F (36.8 C)     Temp Source Oral     SpO2 100 %     Weight      Height      Head Circumference      Peak Flow      Pain Score 0     Pain Loc      Pain Education      Exclude from Growth Chart     Most recent vital signs: Vitals:   04/22/23 0214 04/22/23 0508  BP: (!) 168/92 (!) 151/89  Pulse: 86 87  Resp: 18 18  Temp: 98.3 F (36.8 C) 98.1 F (36.7 C)  SpO2: 100% 100%    Physical Exam Constitutional:      Appearance: She is well-developed.  HENT:     Head: Atraumatic.  Eyes:     Conjunctiva/sclera: Conjunctivae normal.  Cardiovascular:     Rate and Rhythm: Regular rhythm.  Pulmonary:     Effort: No respiratory distress.     Breath sounds: Normal breath sounds.  Abdominal:     General:  There is no distension.     Tenderness: There is no abdominal tenderness.  Musculoskeletal:        General: Normal range of motion.     Cervical back: Normal range of motion.  Skin:    General: Skin is warm.  Neurological:     Mental Status: She is alert. Mental status is at baseline.     IMPRESSION / MDM / ASSESSMENT AND PLAN / ED COURSE  I reviewed the triage vital signs and the nursing notes.  On chart review patient had a recent ED visit for atrial fibrillation with a rapid rate.  Was given 1 dose of IV diltiazem and converted back into normal sinus rhythm.  Had low Chad Vasc score so was not started on anticoagulation.  Evaluated her primary care physician yesterday and was in normal sinus rhythm.  Does have a history of hypertension but I can only see that she is on metoprolol  for blood pressure.  Patient had a recent D-dimer that was negative.  Differential diagnosis  including dysrhythmia, electrolyte abnormality, thyroid  disorder, ACS  EKG  I, Clotilda Punter, the attending physician, personally viewed and interpreted this ECG.   Rate: Normal  Rhythm: Normal sinus  Axis: Normal  Intervals: Normal  ST&T Change: None  No tachycardic or bradycardic dysrhythmias while on cardiac telemetry.  RADIOLOGY I independently reviewed imaging, my interpretation of imaging: Chest x-ray no signs of pneumonia  LABS (all labs ordered are listed, but only abnormal results are displayed) Labs interpreted as -    Labs Reviewed  BASIC METABOLIC PANEL - Abnormal; Notable for the following components:      Result Value   Potassium 3.4 (*)    Glucose, Bld 113 (*)    All other components within normal limits  CBC  TSH  TROPONIN I (HIGH SENSITIVITY)  TROPONIN I (HIGH SENSITIVITY)     MDM  EKG with no findings of atrial fibrillation.  Lab work reassuring.  Serial troponins are negative and low risk heart score.  Patient has referral to cardiology for Zio patch and further workup for  atrial fibrillation.  Given return precautions.     PROCEDURES:  Critical Care performed: No  Procedures  Patient's presentation is most consistent with acute presentation with potential threat to life or bodily function.   MEDICATIONS ORDERED IN ED: Medications - No data to display  FINAL CLINICAL IMPRESSION(S) / ED DIAGNOSES   Final diagnoses:  Palpitations     Rx / DC Orders   ED Discharge Orders     None        Note:  This document was prepared using Dragon voice recognition software and may include unintentional dictation errors.   Punter Clotilda, MD 04/22/23 [phone]

## 2023-04-22 NOTE — Discharge Instructions (Signed)
 You are seen in the emergency department for heart palpitations.  You had 2 heart enzymes that were normal, do not believe you are having a heart attack today.  Your thyroid  studies were normal.  Your EKG did not show any signs of atrial fibrillation and you are in normal sinus rhythm.  Your blood pressure improved and was 120/80 in the emergency department.  Follow-up closely with your primary care physician.  Your primary care physician gave your referral for cardiology for Zio patch and further evaluation for atrial fibrillation.  Return to the emergency department if you have any return of symptoms.  Thank you for choosing us  for your health care, it was my pleasure to care for you today!  Clotilda Punter, MD

## 2023-04-22 NOTE — ED Triage Notes (Signed)
 Pt states that she thinks she may be having an episode of afib that woke her from her sleep, denies SOB. Pt took an extra metoprolol tonight

## 2023-05-08 DIAGNOSIS — I480 Paroxysmal atrial fibrillation: Secondary | ICD-10-CM

## 2023-05-18 ENCOUNTER — Ambulatory Visit: Payer: 59 | Attending: Family Medicine

## 2023-05-18 DIAGNOSIS — I480 Paroxysmal atrial fibrillation: Secondary | ICD-10-CM

## 2023-05-18 LAB — ECHOCARDIOGRAM COMPLETE
AV Mean grad: 3 mm[Hg]
AV Peak grad: 6.6 mm[Hg]
Ao pk vel: 1.28 m/s
Area-P 1/2: 3.21 cm2
S' Lateral: 3.3 cm

## 2023-05-21 ENCOUNTER — Encounter: Payer: Self-pay | Admitting: Family Medicine

## 2023-05-21 ENCOUNTER — Other Ambulatory Visit: Payer: Self-pay | Admitting: Family Medicine

## 2023-05-21 DIAGNOSIS — I340 Nonrheumatic mitral (valve) insufficiency: Secondary | ICD-10-CM | POA: Insufficient documentation

## 2023-05-21 DIAGNOSIS — I480 Paroxysmal atrial fibrillation: Secondary | ICD-10-CM

## 2023-05-21 DIAGNOSIS — R779 Abnormality of plasma protein, unspecified: Secondary | ICD-10-CM

## 2023-05-21 DIAGNOSIS — E785 Hyperlipidemia, unspecified: Secondary | ICD-10-CM

## 2023-05-21 DIAGNOSIS — E559 Vitamin D deficiency, unspecified: Secondary | ICD-10-CM

## 2023-05-23 ENCOUNTER — Other Ambulatory Visit (INDEPENDENT_AMBULATORY_CARE_PROVIDER_SITE_OTHER): Payer: 59

## 2023-05-23 DIAGNOSIS — E785 Hyperlipidemia, unspecified: Secondary | ICD-10-CM

## 2023-05-23 DIAGNOSIS — R779 Abnormality of plasma protein, unspecified: Secondary | ICD-10-CM

## 2023-05-23 DIAGNOSIS — E559 Vitamin D deficiency, unspecified: Secondary | ICD-10-CM | POA: Diagnosis not present

## 2023-05-23 LAB — COMPREHENSIVE METABOLIC PANEL
ALT: 15 U/L (ref 0–35)
AST: 23 U/L (ref 0–37)
Albumin: 4.3 g/dL (ref 3.5–5.2)
Alkaline Phosphatase: 119 U/L — ABNORMAL HIGH (ref 39–117)
BUN: 13 mg/dL (ref 6–23)
CO2: 26 meq/L (ref 19–32)
Calcium: 9.2 mg/dL (ref 8.4–10.5)
Chloride: 104 meq/L (ref 96–112)
Creatinine, Ser: 0.82 mg/dL (ref 0.40–1.20)
GFR: 77.99 mL/min (ref >60.00)
Glucose, Bld: 87 mg/dL (ref 70–99)
Potassium: 4 meq/L (ref 3.5–5.1)
Sodium: 141 meq/L (ref 135–145)
Total Bilirubin: 0.3 mg/dL (ref 0.2–1.2)
Total Protein: 8.1 g/dL (ref 6.0–8.3)

## 2023-05-23 LAB — LIPID PANEL
Cholesterol: 203 mg/dL — ABNORMAL HIGH (ref 0–200)
HDL: 63.4 mg/dL (ref >39.00)
LDL Cholesterol: 121 mg/dL — ABNORMAL HIGH (ref 0–99)
NonHDL: 139.11
Total CHOL/HDL Ratio: 3
Triglycerides: 93 mg/dL (ref 0.0–149.0)
VLDL: 18.6 mg/dL (ref 0.0–40.0)

## 2023-05-23 LAB — VITAMIN D 25 HYDROXY (VIT D DEFICIENCY, FRACTURES): VITD: 35.52 ng/mL (ref 30.00–100.00)

## 2023-05-26 LAB — PROTEIN ELECTROPHORESIS, SERUM, WITH REFLEX
Albumin ELP: 4.1 g/dL (ref 3.8–4.8)
Alpha 1: 0.3 g/dL (ref 0.2–0.3)
Alpha 2: 0.6 g/dL (ref 0.5–0.9)
Beta 2: 0.7 g/dL — ABNORMAL HIGH (ref 0.2–0.5)
Beta Globulin: 0.5 g/dL (ref 0.4–0.6)
Gamma Globulin: 1.9 g/dL — ABNORMAL HIGH (ref 0.8–1.7)
Total Protein: 8.2 g/dL — ABNORMAL HIGH (ref 6.1–8.1)

## 2023-05-26 LAB — LIPOPROTEIN A (LPA): Lipoprotein (a): 71 nmol/L (ref <75)

## 2023-05-30 ENCOUNTER — Ambulatory Visit (INDEPENDENT_AMBULATORY_CARE_PROVIDER_SITE_OTHER): Payer: 59 | Admitting: Family Medicine

## 2023-05-30 ENCOUNTER — Encounter: Payer: Self-pay | Admitting: Family Medicine

## 2023-05-30 VITALS — BP 128/82 | HR 63 | Temp 98.6°F | Ht 67.5 in | Wt 246.2 lb

## 2023-05-30 DIAGNOSIS — I480 Paroxysmal atrial fibrillation: Secondary | ICD-10-CM | POA: Diagnosis not present

## 2023-05-30 DIAGNOSIS — K760 Fatty (change of) liver, not elsewhere classified: Secondary | ICD-10-CM

## 2023-05-30 DIAGNOSIS — Z23 Encounter for immunization: Secondary | ICD-10-CM

## 2023-05-30 DIAGNOSIS — E7800 Pure hypercholesterolemia, unspecified: Secondary | ICD-10-CM

## 2023-05-30 DIAGNOSIS — I340 Nonrheumatic mitral (valve) insufficiency: Secondary | ICD-10-CM | POA: Diagnosis not present

## 2023-05-30 DIAGNOSIS — E559 Vitamin D deficiency, unspecified: Secondary | ICD-10-CM

## 2023-05-30 DIAGNOSIS — G4733 Obstructive sleep apnea (adult) (pediatric): Secondary | ICD-10-CM | POA: Insufficient documentation

## 2023-05-30 DIAGNOSIS — Z0001 Encounter for general adult medical examination with abnormal findings: Secondary | ICD-10-CM | POA: Diagnosis not present

## 2023-05-30 DIAGNOSIS — R779 Abnormality of plasma protein, unspecified: Secondary | ICD-10-CM

## 2023-05-30 DIAGNOSIS — Z7184 Encounter for health counseling related to travel: Secondary | ICD-10-CM

## 2023-05-30 MED ORDER — VIVOTIF PO CPDR: 1.0000 | DELAYED_RELEASE_CAPSULE | ORAL | 0 refills | Status: DC

## 2023-05-30 MED ORDER — ATOVAQUONE-PROGUANIL HCL 250-100 MG PO TABS: 1.0000 | ORAL_TABLET | Freq: Every day | ORAL | 0 refills | Status: DC

## 2023-05-30 NOTE — Assessment & Plan Note (Signed)
 Preventative protocols reviewed and updated unless pt declined. Discussed healthy diet and lifestyle.

## 2023-05-30 NOTE — Patient Instructions (Addendum)
Tdap today  Home sleep study showed mild-moderate sleep apnea. We will order CPAP for nightly sleep.  Schedule well woman exam.  Call to schedule mammogram at your convenience: [facility] at [facility] [phone] Take vivotif 2 wks prior to travel for typhoid vaccine. Take Malarone 1d prior to travel and continue through 1 wk after return for malaria prevention.  Return in 6months for follow up visit

## 2023-05-30 NOTE — Progress Notes (Unsigned)
Ph: [phone] Fax: [phone]   Patient ID: Breanna Weber, female    DOB: [birth year], 58 y.o.   MRN: [id number]  This visit was conducted in person.  BP 128/82   Pulse 63   Temp 98.6 F (37 C) (Oral)   Ht 5' 7.5" (1.715 m)   Wt 246 lb 4 oz (111.7 kg)   LMP 07/29/2015   SpO2 98%   BMI 38.00 kg/m    CC: CPE Subjective:   HPI: Breanna Weber is a 58 y.o. female presenting on 05/30/2023 for Annual Exam   Upcoming trip planned to [place] 07/31/2023 for 1 week.   See prior note for details. Recent ER visits x2 for parox atrial fibrillation.  Latest 04/22/2023.  AC not started due to low CHADSVASC2 score.  Notes ongoing trouble sleeping - easily awakens at night, some non-restorative sleep.  HST showed mild-mod sleep apnea. She notes she had trouble sleeping with this. She'd prefer nasal pillow if able. Not interested in [place]. Will check with dentist on oral appliance.   Preventative: COLONOSCOPY WITH PROPOFOL 04/10/2019 - HP, diverticulosis, rpt 10 yrs (Maximino Greenland, Dolphus Jenny, MD) Well woman exam with OBGYN Dr Vergie Living, last seen 12/2020 with normal pap smear. H/o fibroids.  LMP: 07/2014 Mammogram 01/2021 - [facility] @ [place]  Lung cancer screening - not eligible  Flu shot - yearly COVID vaccine Pfizer 06/2019, 07/2019  Tetanus - 2004, rpt today Shingrix - 03/2021, 05/2022 Seat belt use discussed  Sunscreen use discussed. No changing moles on skin.  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 Interim medical history since our last visit reviewed. Allergies and medications reviewed and updated. Outpatient Medications Prior to Visit  Medication Sig Dispense Refill   Cholecalciferol (VITAMIN D3) 1.25 MG (50000 UT) TABS Take 1 tablet by mouth once a week. 12 tablet 4   metoprolol tartrate (LOPRESSOR) 25 MG tablet Take 1 tablet (25 mg total) by mouth 2 (two) times daily. 180 tablet 3   sulfamethoxazole-trimethoprim (BACTRIM) 400-80 MG tablet Take 1 tablet by mouth once as needed for up to 1 dose. After sex 30 tablet 1   No facility-administered medications prior to visit.     Per HPI unless specifically indicated in ROS section below Review of Systems  Constitutional:  Negative for activity change, appetite change, chills, fatigue, fever and unexpected weight change.  HENT:  Negative for hearing loss.   Eyes:  Negative for visual disturbance.  Respiratory:  Negative for cough, chest tightness, shortness of breath and wheezing.   Cardiovascular:  Positive for palpitations and leg swelling (with prolonged sitting). Negative for chest pain.  Gastrointestinal:  Negative for abdominal distention, abdominal pain, blood in stool, constipation, diarrhea, nausea and vomiting.  Genitourinary:  Negative for difficulty urinating and hematuria.  Musculoskeletal:  Negative for arthralgias, myalgias and neck pain.  Skin:  Negative for rash.  Neurological:  Negative for dizziness, seizures, syncope and headaches.  Hematological:  Negative for adenopathy. Does not bruise/bleed easily.  Psychiatric/Behavioral:  Negative for dysphoric mood.  The patient is not nervous/anxious.     Objective:  BP 128/82   Pulse 63   Temp 98.6 F (37 C) (Oral)   Ht 5' 7.5" (1.715 m)   Wt 246 lb 4 oz (111.7 kg)   LMP 07/29/2015   SpO2 98%   BMI 38.00 kg/m   Wt Readings from Last 3 Encounters:  05/30/23 246 lb 4 oz (111.7 kg)  04/19/23 254 lb 8 oz (115.4 kg)  05/25/22 243 lb 2 oz (110.3 kg)       Physical Exam Vitals and nursing note reviewed.  Constitutional:      Appearance: Normal appearance. She is not ill-appearing.  HENT:     Head: Normocephalic and atraumatic.     Right Ear: Tympanic membrane, ear canal and external ear normal. There is no impacted cerumen.     Left Ear: Tympanic membrane, ear canal and external ear normal. There is no impacted cerumen.     Mouth/Throat:     Mouth: Mucous membranes are moist.     Pharynx: Oropharynx is clear. No oropharyngeal exudate or posterior oropharyngeal erythema.  Eyes:     General:        Right eye: No discharge.        Left eye: No discharge.     Extraocular Movements: Extraocular movements intact.     Conjunctiva/sclera: Conjunctivae normal.     Pupils: Pupils are equal, round, and reactive to light.  Neck:     Thyroid: No thyroid mass or thyromegaly.  Cardiovascular:     Rate and Rhythm: Normal rate and regular rhythm.     Pulses: Normal pulses.     Heart sounds: Normal heart sounds. No murmur heard. Pulmonary:     Effort: Pulmonary effort is normal. No respiratory distress.     Breath sounds: Normal breath sounds. No wheezing, rhonchi or rales.  Abdominal:     General: Bowel sounds are normal. There is no distension.     Palpations: Abdomen is soft. There is no mass.     Tenderness: There is no abdominal tenderness. There is no guarding or rebound.     Hernia: No hernia is present.  Musculoskeletal:     Cervical back: Normal range of motion and neck supple. No rigidity.     Right lower leg: No edema.     Left lower leg: No edema.  Lymphadenopathy:     Cervical: No cervical adenopathy.  Skin:    General: Skin is warm and dry.     Findings: No rash.  Neurological:     General: No focal deficit present.     Mental Status: She is alert. Mental status is at baseline.  Psychiatric:        Mood and Affect: Mood normal.        Behavior: Behavior normal.       Results for orders placed or performed in visit on  05/23/23  Lipoprotein A (LPA)   Collection Time: 05/23/23  8:06 AM  Result Value Ref Range   Lipoprotein (a) 71 <75 nmol/L  Serum protein electrophoresis with reflex   Collection Time: 05/23/23  8:06 AM  Result Value Ref Range   Total Protein 8.2 (H) 6.1 - 8.1 g/dL   Albumin ELP 4.1 3.8 - 4.8 g/dL   Alpha 1 0.3 0.2 - 0.3 g/dL   Alpha 2 0.6 0.5 - 0.9 g/dL   Beta Globulin 0.5 0.4 - 0.6 g/dL   Beta 2 0.7 (H) 0.2 - 0.5 g/dL  Gamma Globulin 1.9 (H) 0.8 - 1.7 g/dL   SPE Interp.    VITAMIN D 25 Hydroxy (Vit-D Deficiency, Fractures)   Collection Time: 05/23/23  8:06 AM  Result Value Ref Range   VITD 35.52 30.00 - 100.00 ng/mL  Comprehensive metabolic panel   Collection Time: 05/23/23  8:06 AM  Result Value Ref Range   Sodium 141 135 - 145 mEq/L   Potassium 4.0 3.5 - 5.1 mEq/L   Chloride 104 96 - 112 mEq/L   CO2 26 19 - 32 mEq/L   Glucose, Bld 87 70 - 99 mg/dL   BUN 13 6 - 23 mg/dL   Creatinine, Ser 1.61 0.40 - 1.20 mg/dL   Total Bilirubin 0.3 0.2 - 1.2 mg/dL   Alkaline Phosphatase 119 (H) 39 - 117 U/L   AST 23 0 - 37 U/L   ALT 15 0 - 35 U/L   Total Protein 8.1 6.0 - 8.3 g/dL   Albumin 4.3 3.5 - 5.2 g/dL   GFR 09.60 >45.40 mL/min   Calcium 9.2 8.4 - 10.5 mg/dL  Lipid panel   Collection Time: 05/23/23  8:06 AM  Result Value Ref Range   Cholesterol 203 (H) 0 - 200 mg/dL   Triglycerides 98.1 0.0 - 149.0 mg/dL   HDL 19.14 >78.29 mg/dL   VLDL 56.2 0.0 - 13.0 mg/dL   LDL Cholesterol 865 (H) 0 - 99 mg/dL   Total CHOL/HDL Ratio 3    NonHDL 139.11    Echocardiogram 05/2023: LVEF 55-60%, normal wall motion, mildly dilated LA, mild-mod MR, normal AV.   HST 05/2023: AHI 12.7, RDI 28.8, desat 1% nadir 78%, mild-moderate sleep apnea  Heart monitor (Zio patch) pending   Assessment & Plan:   Problem List Items Addressed This Visit     Encounter for general adult medical examination with abnormal findings - Primary (Chronic)   Preventative protocols reviewed and updated unless pt  declined. Discussed healthy diet and lifestyle.       Paroxysmal atrial fibrillation (HCC)   Mitral valve regurgitation   Obstructive sleep apnea     Meds ordered this encounter  Medications   typhoid (VIVOTIF) DR capsule    Sig: Take 1 capsule by mouth every other day. Start 2 weeks prior to travel    Dispense:  4 capsule    Refill:  0   atovaquone-proguanil (MALARONE) 250-100 MG TABS tablet    Sig: Take 1 tablet by mouth daily. Start 1 day prior to travel, complete 7 days after return    Dispense:  15 tablet    Refill:  0    No orders of the defined types were placed in this encounter.   Patient Instructions  Tdap today  Home sleep study showed mild-moderate sleep apnea. We will order CPAP for nightly sleep.  Schedule well woman exam.  Call to schedule mammogram at your convenience: [facility] at [facility] [phone]   Follow up plan: Return in about 6 months (around 11/27/2023), or if symptoms worsen or fail to improve, for follow up visit.  Eustaquio Boyden, MD

## 2023-05-31 DIAGNOSIS — Z7184 Encounter for health counseling related to travel: Secondary | ICD-10-CM | POA: Insufficient documentation

## 2023-05-31 NOTE — Assessment & Plan Note (Signed)
Continue to encourage healthy diet and lifestyle choices to affect sustainable weight loss This contributes to OSA risk.

## 2023-05-31 NOTE — Assessment & Plan Note (Signed)
Upcoming trip in April [place] for 1 week.  Reviewed CDC recommendations for travel.  Discussed safe water - rec bottled water use to prevent GI illness.  Update Tdap, suggested updated COVID booster.  Rx vivotif oral typhoid vaccine with instructions when to start.  Rx malarone malaria ppx. Recommend yellow fever vaccine - she will contact local health department for this.

## 2023-05-31 NOTE — Assessment & Plan Note (Signed)
Chronic, off medication. Low Lp(a) score. The 10-year ASCVD risk score (Arnett DK, et al., 2019) is: 3.7%   Values used to calculate the score:     Age: 58 years     Sex: Female     Is Non-Hispanic African American: No     Diabetic: No     Tobacco smoker: No     Systolic Blood Pressure: 128 mmHg     Is BP treated: Yes     HDL Cholesterol: 63.4 mg/dL     Total Cholesterol: 203 mg/dL d

## 2023-05-31 NOTE — Assessment & Plan Note (Signed)
Mild to moderate on echocardiogram. Will continue to monitor, consider yearly [place].

## 2023-05-31 NOTE — Assessment & Plan Note (Addendum)
New diagnosis. This could contribute to afib risk.  Reviewed treatment options - oral appliance through dentist vs CPAP.  She desires to try CPAP with nasal pillow - will send request for autoCPAP 5-20 cmH2O through [facility].  RTC 6 mo OSA on CPAP f/u

## 2023-05-31 NOTE — Assessment & Plan Note (Signed)
Continue 50k units weekly.

## 2023-05-31 NOTE — Assessment & Plan Note (Signed)
Chronic, stable period on low dose metoprolol 25mg  bid Low chadsvasc score so not on AC.  Found to have mild-mod OSA - see below. Zio patch heart monitor results pending Echo overall reassuring.

## 2023-05-31 NOTE — Assessment & Plan Note (Signed)
Mildly elevated protein levels without M spike on SPEP.

## 2023-06-10 ENCOUNTER — Encounter: Payer: Self-pay | Admitting: Family Medicine

## 2023-06-28 ENCOUNTER — Other Ambulatory Visit: Payer: Self-pay | Admitting: Family Medicine

## 2023-06-28 DIAGNOSIS — E559 Vitamin D deficiency, unspecified: Secondary | ICD-10-CM

## 2023-07-06 ENCOUNTER — Telehealth: Payer: Self-pay

## 2023-07-06 NOTE — Telephone Encounter (Signed)
 Received faxed order from Terri Romilda Garret, Lead CSR at [facility] for CPAP supplies.   Placed order in Dr Timoteo Expose box.

## 2023-07-06 NOTE — Telephone Encounter (Signed)
 Filled and in Lisa's box.

## 2023-07-07 NOTE — Telephone Encounter (Signed)
 Faxed order to [facility] at [phone]; attn:  Berneice Gandy.

## 2023-07-11 ENCOUNTER — Other Ambulatory Visit: Payer: Self-pay | Admitting: Family Medicine

## 2023-07-11 DIAGNOSIS — Z1231 Encounter for screening mammogram for malignant neoplasm of breast: Secondary | ICD-10-CM

## 2023-07-18 ENCOUNTER — Ambulatory Visit
Admission: RE | Admit: 2023-07-18 | Discharge: 2023-07-18 | Disposition: A | Discharge status: Home / Self Care | Source: Ambulatory Visit | Attending: Family Medicine | Admitting: Family Medicine

## 2023-07-18 DIAGNOSIS — Z1231 Encounter for screening mammogram for malignant neoplasm of breast: Secondary | ICD-10-CM

## 2023-08-11 ENCOUNTER — Telehealth: Payer: Self-pay

## 2023-08-11 NOTE — Telephone Encounter (Signed)
 Relayed message to pt via MyChart. Fyi to Dr Crissie Dome.

## 2023-08-11 NOTE — Telephone Encounter (Signed)
 Copied from CRM [id number]. Topic: Clinical - Medical Advice >> Aug 11, 2023 10:37 AM Jethro Morrison wrote: Reason for CRM: KRISTEN WITH [facility] STATED THEY HAVE BEEN TRYING TO REACH PT SEVERAL TIMES TO SCHEDULE AN APPT FOR HER CPAP MACHINE AND UNSUCCESSFUL. STATED THEY ARE CANCELING THE ORDER. THANKS

## 2023-11-28 ENCOUNTER — Other Ambulatory Visit: Payer: Self-pay | Admitting: Family Medicine

## 2023-11-28 ENCOUNTER — Ambulatory Visit: Payer: 59 | Admitting: Family Medicine

## 2023-11-28 ENCOUNTER — Ambulatory Visit (INDEPENDENT_AMBULATORY_CARE_PROVIDER_SITE_OTHER)
Admission: RE | Admit: 2023-11-28 | Discharge: 2023-11-28 | Disposition: A | Discharge status: Home / Self Care | Source: Ambulatory Visit | Attending: Family Medicine | Admitting: Family Medicine

## 2023-11-28 ENCOUNTER — Encounter: Payer: Self-pay | Admitting: Family Medicine

## 2023-11-28 VITALS — BP 136/86 | HR 73 | Temp 98.4°F | Ht 67.5 in | Wt 249.5 lb

## 2023-11-28 DIAGNOSIS — M25561 Pain in right knee: Secondary | ICD-10-CM

## 2023-11-28 DIAGNOSIS — I480 Paroxysmal atrial fibrillation: Secondary | ICD-10-CM

## 2023-11-28 DIAGNOSIS — G8929 Other chronic pain: Secondary | ICD-10-CM

## 2023-11-28 DIAGNOSIS — G4733 Obstructive sleep apnea (adult) (pediatric): Secondary | ICD-10-CM

## 2023-11-28 DIAGNOSIS — M25562 Pain in left knee: Secondary | ICD-10-CM

## 2023-11-28 MED ORDER — NAPROXEN 375 MG PO TABS: 375.0000 mg | ORAL_TABLET | Freq: Two times a day (BID) | ORAL | 0 refills | Status: AC

## 2023-11-28 NOTE — Patient Instructions (Addendum)
 We will resubmit request for CPAP commencement.  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 Treat with course of naprosyn  sent to pharmacy as well as topical voltaren over the counter. Use knee brace, ice, elevation. Let us  know if not improving, consider steroid injection ([facility]).  Good to see you today Return in 6 months for physical.

## 2023-11-28 NOTE — Assessment & Plan Note (Signed)
 Reviewed healthy diet and lifestyle changes to effect sustainable weight loss.

## 2023-11-28 NOTE — Progress Notes (Signed)
 Ph: [phone] Fax: [phone]   Patient ID: Breanna Weber, female    DOB: Jul 03, 1965, 58 y.o.   MRN: [id number]  This visit was conducted in person.  BP 136/86   Pulse 73   Temp 98.4 F (36.9 C) (Oral)   Ht 5' 7.5 (1.715 m)   Wt 249 lb 8 oz (113.2 kg)   LMP 07/29/2015   SpO2 98%   BMI 38.50 kg/m    CC: 6 mo f/u visit  Subjective:   HPI: Breanna Weber is a 58 y.o. female presenting on 11/28/2023 for Medical Management of Chronic Issues (Pt here for 6 mo f/u Breanna Weber pt C/o R knee. Started hurting two weeks ago. )   Recent trip to [place] 07/2023.   [facility] - [facility] was unable to reach patient for CPAP scheduling - referral was cancelled.  HST 05/2023: AHI 12.7, RDI 28.8, desat 1% nadir 78%, mild-moderate sleep apnea  From prior note: She'd prefer nasal pillow if able. Not interested in [place]. Will check with dentist on oral appliance.   Afib earlier in the year - not started on [facility] due to low chadsvasc2 score. Has not seen cardiology.  Zio patch 05/2023: HR 40 to 138, average 62. 1 nonsustained VT lasting 4 beats. 1 nonsustained SVT lasting 6 beats. Rare supraventricular and ventricular ectopy. No sustained arrhythmias. No atrial fibrillation.  R knee pain - for several weeks - hurts to sides and back, very swollen.  Worse going up/down stairs.  Denies inciting trauma/injury.  She has started using hemp patch (lidocaine  and menthol.  Taking tylenol for pain as well.  She was a Building services engineer.  Last steroid injection 6-8 yrs ago - tolerated well and very beneficial.  She prefers to avoid surgery.      Relevant past medical, surgical, family and social history reviewed and updated as indicated. Interim medical history since our last visit reviewed. Allergies and medications reviewed and updated. Outpatient Medications Prior to Visit  Medication Sig Dispense Refill   Cholecalciferol (VITAMIN D3) 1.25 MG (50000 UT) CAPS TAKE 1 TABLET BY MOUTH ONE TIME PER WEEK 12  capsule 4   metoprolol  tartrate (LOPRESSOR ) 25 MG tablet Take 1 tablet (25 mg total) by mouth 2 (two) times daily. 180 tablet 3   sulfamethoxazole -trimethoprim  (BACTRIM ) 400-80 MG tablet Take 1 tablet by mouth once as needed for up to 1 dose. After sex 30 tablet 1   atovaquone -proguanil (MALARONE ) 250-100 MG TABS tablet Take 1 tablet by mouth daily. Start 1 day prior to travel, complete 7 days after return 15 tablet 0   typhoid (VIVOTIF ) DR capsule Take 1 capsule by mouth every other day. Start 2 weeks prior to travel 4 capsule 0   No facility-administered medications prior to visit.     Per HPI unless specifically indicated in ROS section below Review of Systems  Objective:  BP 136/86   Pulse 73   Temp 98.4 F (36.9 C) (Oral)   Ht 5' 7.5 (1.715 m)   Wt 249 lb 8 oz (113.2 kg)   LMP 07/29/2015   SpO2 98%   BMI 38.50 kg/m   Wt Readings from Last 3 Encounters:  11/28/23 249 lb 8 oz (113.2 kg)  05/30/23 246 lb 4 oz (111.7 kg)  04/19/23 254 lb 8 oz (115.4 kg)      Physical Exam Vitals and nursing note reviewed.  Constitutional:      Appearance: Normal appearance. She is not ill-appearing.  HENT:     Head:  Normocephalic and atraumatic.     Mouth/Throat:     Mouth: Mucous membranes are moist.     Pharynx: Oropharynx is clear. No oropharyngeal exudate or posterior oropharyngeal erythema.  Eyes:     Extraocular Movements: Extraocular movements intact.     Pupils: Pupils are equal, round, and reactive to light.  Neck:     Thyroid : No thyroid  mass or thyromegaly.  Cardiovascular:     Rate and Rhythm: Normal rate and regular rhythm.     Pulses: Normal pulses.     Heart sounds: Normal heart sounds. No murmur heard. Pulmonary:     Effort: Pulmonary effort is normal. No respiratory distress.     Breath sounds: Normal breath sounds. No wheezing, rhonchi or rales.  Musculoskeletal:        General: Swelling and tenderness present.     Cervical back: Normal range of motion and neck  supple.     Right lower leg: No edema.     Left lower leg: No edema.     Comments:  L knee WNL, mild crepitus with ROM R knee exam: No deformity on inspection. Discomfort with palpation of medial/lateral joint lines ++ effusion/swelling noted. FROM in flex/extension with crepitus. ++ popliteal fullness. No pain with valgus/varus stress. No PFgrind. No abnormal patellar mobility.   Skin:    General: Skin is warm and dry.     Findings: No rash.  Neurological:     Mental Status: She is alert.  Psychiatric:        Mood and Affect: Mood normal.        Behavior: Behavior normal.       Results for orders placed or performed in visit on 05/23/23  Lipoprotein A (LPA)   Collection Time: 05/23/23  8:06 AM  Result Value Ref Range   Lipoprotein (a) 71 <75 nmol/L  Serum protein electrophoresis with reflex   Collection Time: 05/23/23  8:06 AM  Result Value Ref Range   Total Protein 8.2 (H) 6.1 - 8.1 g/dL   Albumin ELP 4.1 3.8 - 4.8 g/dL   Alpha 1 0.3 0.2 - 0.3 g/dL   Alpha 2 0.6 0.5 - 0.9 g/dL   Beta Globulin 0.5 0.4 - 0.6 g/dL   Beta 2 0.7 (H) 0.2 - 0.5 g/dL   Gamma Globulin 1.9 (H) 0.8 - 1.7 g/dL   SPE Interp.    VITAMIN D  25 Hydroxy (Vit-D Deficiency, Fractures)   Collection Time: 05/23/23  8:06 AM  Result Value Ref Range   VITD 35.52 30.00 - 100.00 ng/mL  Comprehensive metabolic panel   Collection Time: 05/23/23  8:06 AM  Result Value Ref Range   Sodium 141 135 - 145 mEq/L   Potassium 4.0 3.5 - 5.1 mEq/L   Chloride 104 96 - 112 mEq/L   CO2 26 19 - 32 mEq/L   Glucose, Bld 87 70 - 99 mg/dL   BUN 13 6 - 23 mg/dL   Creatinine, Ser 9.17 0.40 - 1.20 mg/dL   Total Bilirubin 0.3 0.2 - 1.2 mg/dL   Alkaline Phosphatase 119 (H) 39 - 117 U/L   AST 23 0 - 37 U/L   ALT 15 0 - 35 U/L   Total Protein 8.1 6.0 - 8.3 g/dL   Albumin 4.3 3.5 - 5.2 g/dL   GFR 22.00 >39.99 mL/min   Calcium 9.2 8.4 - 10.5 mg/dL  Lipid panel   Collection Time: 05/23/23  8:06 AM  Result Value Ref Range    Cholesterol 203 (H)  0 - 200 mg/dL   Triglycerides 06.9 0.0 - 149.0 mg/dL   HDL 36.59 >60.99 mg/dL   VLDL 81.3 0.0 - 59.9 mg/dL   LDL Cholesterol 878 (H) 0 - 99 mg/dL   Total CHOL/HDL Ratio 3    NonHDL 139.11     Assessment & Plan:   Problem List Items Addressed This Visit     Severe obesity (BMI 35.0-39.9) with comorbidity (HCC)   Reviewed healthy diet and lifestyle changes to effect sustainable weight loss.        Bilateral knee pain   Presumed OA related.       Paroxysmal atrial fibrillation (HCC)   Chronic, stable period on metoprolol  25mg  bid.  Sounds regular on exam. Recent Zio patch without afib or sustained arrhythmia.       Obstructive sleep apnea   Discussed relation of OSA and afib.  Will resubmit request to [facility] for home CPAP treatment with nasal pillow.       Acute pain of right knee - Primary   Suspect R knee osteoarthritis flare.  Check baseline films.  ERx naprosyn  375mg  , rec OTC voltaren, ice, knee brace, leg elevation.  Update if not improved for steroid injection consideration.       Relevant Orders   DG Knee Complete 4 Views Right     Meds ordered this encounter  Medications   naproxen  (NAPROSYN ) 375 MG tablet    Sig: Take 1 tablet (375 mg total) by mouth 2 (two) times daily with a meal. For 5 days then as needed    Dispense:  40 tablet    Refill:  0    Orders Placed This Encounter  Procedures   DG Knee Complete 4 Views Right    Standing Status:   Future    Expiration Date:   11/27/2024    Reason for Exam (SYMPTOM  OR DIAGNOSIS REQUIRED):   R knee pain and swelling without injury eval osteoarthritis    Is patient pregnant?:   No    Preferred imaging location?:   [facility]    Patient Instructions  We will resubmit request for CPAP commencement.  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 Treat with course of naprosyn  sent to pharmacy as well as topical voltaren over the counter. Use knee  brace, ice, elevation. Let us  know if not improving, consider steroid injection ([facility]).  Good to see you today Return in 6 months for physical.   Follow up plan: Return in about 6 months (around 05/30/2024) for annual exam, prior fasting for blood work.  Anton Blas, MD

## 2023-11-28 NOTE — Assessment & Plan Note (Signed)
 Chronic, stable period on metoprolol  25mg  bid.  Sounds regular on exam. Recent Zio patch without afib or sustained arrhythmia.

## 2023-11-28 NOTE — Assessment & Plan Note (Addendum)
 Discussed relation of OSA and afib.  Will resubmit request to [facility] for home CPAP treatment with nasal pillow.

## 2023-11-28 NOTE — Assessment & Plan Note (Signed)
 Suspect R knee osteoarthritis flare.  Check baseline films.  ERx naprosyn  375mg  , rec OTC voltaren, ice, knee brace, leg elevation.  Update if not improved for steroid injection consideration.

## 2023-11-28 NOTE — Assessment & Plan Note (Signed)
 Presumed OA related.

## 2023-11-30 ENCOUNTER — Ambulatory Visit: Payer: Self-pay | Admitting: Family Medicine

## 2023-11-30 ENCOUNTER — Encounter: Payer: Self-pay | Admitting: Family Medicine

## 2023-11-30 NOTE — Telephone Encounter (Signed)
 Please verify CPAP order was resubmitted to [facility] this week. I don't see records of this.

## 2023-12-09 ENCOUNTER — Ambulatory Visit: Payer: Self-pay | Admitting: Family Medicine

## 2023-12-16 ENCOUNTER — Emergency Department
Admission: EM | Admit: 2023-12-16 | Discharge: 2023-12-16 | Disposition: A | Discharge status: Home / Self Care | Attending: Emergency Medicine | Admitting: Emergency Medicine

## 2023-12-16 ENCOUNTER — Emergency Department

## 2023-12-16 ENCOUNTER — Other Ambulatory Visit: Payer: Self-pay

## 2023-12-16 DIAGNOSIS — I10 Essential (primary) hypertension: Secondary | ICD-10-CM | POA: Insufficient documentation

## 2023-12-16 DIAGNOSIS — I4891 Unspecified atrial fibrillation: Secondary | ICD-10-CM | POA: Insufficient documentation

## 2023-12-16 DIAGNOSIS — Z79899 Other long term (current) drug therapy: Secondary | ICD-10-CM | POA: Diagnosis not present

## 2023-12-16 DIAGNOSIS — R002 Palpitations: Secondary | ICD-10-CM

## 2023-12-16 LAB — TROPONIN I (HIGH SENSITIVITY)
Troponin I (High Sensitivity): 5 ng/L (ref <18)
Troponin I (High Sensitivity): 6 ng/L (ref <18)

## 2023-12-16 LAB — BASIC METABOLIC PANEL WITH GFR
Anion gap: 11 (ref 5–15)
BUN: 13 mg/dL (ref 6–20)
CO2: 25 mmol/L (ref 22–32)
Calcium: 9.4 mg/dL (ref 8.9–10.3)
Chloride: 103 mmol/L (ref 98–111)
Creatinine, Ser: 0.73 mg/dL (ref 0.44–1.00)
GFR, Estimated: >60 mL/min (ref >60)
Glucose, Bld: 119 mg/dL — ABNORMAL HIGH (ref 70–99)
Potassium: 3.9 mmol/L (ref 3.5–5.1)
Sodium: 139 mmol/L (ref 135–145)

## 2023-12-16 LAB — CBC
HCT: 40.9 % (ref 36.0–46.0)
Hemoglobin: 13.3 g/dL (ref 12.0–15.0)
MCH: 30.1 pg (ref 26.0–34.0)
MCHC: 32.5 g/dL (ref 30.0–36.0)
MCV: 92.5 fL (ref 80.0–100.0)
Platelets: 266 K/uL (ref 150–400)
RBC: 4.42 MIL/uL (ref 3.87–5.11)
RDW: 12.2 % (ref 11.5–15.5)
WBC: 4.6 K/uL (ref 4.0–10.5)
nRBC: 0 % (ref 0.0–0.2)

## 2023-12-16 LAB — T4, FREE: Free T4: 0.88 ng/dL (ref 0.61–1.12)

## 2023-12-16 LAB — TSH: TSH: 2.997 u[IU]/mL (ref 0.350–4.500)

## 2023-12-16 LAB — MAGNESIUM: Magnesium: 2 mg/dL (ref 1.7–2.4)

## 2023-12-16 MED ORDER — SODIUM CHLORIDE 0.9 % IV BOLUS
1000.0000 mL | Freq: Once | INTRAVENOUS | Status: AC
  Administered 2023-12-16: 1000 mL via INTRAVENOUS

## 2023-12-16 NOTE — Discharge Instructions (Signed)
 You were seen in the ER for palpitations.  You were noted to be in A-fib, but your heart rates are better after receiving some fluids here.  Please follow-up with cardiology for further evaluation of your A-fib.  Return to the ER for new or worsening symptoms.

## 2023-12-16 NOTE — ED Triage Notes (Signed)
 Pt comes with c/o palpitation that started 2 days ago. Pt state she thinks it might be the medicine. Pt states her heart feels like it is racing.   Pt denies any cp or sob.

## 2023-12-16 NOTE — ED Notes (Signed)
 Pt wheeled to waiting room in wheelchair. PT ambulatory to car.

## 2023-12-16 NOTE — ED Provider Notes (Signed)
 [facility] Provider Note    Event Date/Time   First MD Initiated Contact with Patient 12/16/23 1231     (approximate)   History   Atrial Fibrillation   HPI  Breanna Weber is a 58-year-old female with history of paroxysmal A-fib, hypertension presenting to the Emergency Department for evaluation of palpitations.  2 days ago, patient had onset of palpitations that were worse this morning.  No associated chest pain or shortness of breath.  Has had similar episodes in the past attributed to A-fib.  She reports has been compliant with her metoprolol .  Not on anticoagulation.  Does not think that she is usually in A-fib.  Has recently had knee pain and has been taking an NSAID for this.  She notes that she has had issues with her A-fib flaring when taking NSAIDs in the past and is unsure if this could be related.     Physical Exam   Triage Vital Signs: ED Triage Vitals  Encounter Vitals Group     BP 12/16/23 1120 (!) 149/105     Girls Systolic BP Percentile --      Girls Diastolic BP Percentile --      Boys Systolic BP Percentile --      Boys Diastolic BP Percentile --      Pulse Rate 12/16/23 1120 (!) 102     Resp 12/16/23 1120 18     Temp 12/16/23 1120 98 F (36.7 C)     Temp src --      SpO2 12/16/23 1120 99 %     Weight 12/16/23 1119 240 lb (108.9 kg)     Height 12/16/23 1119 5' 9.5 (1.765 m)     Head Circumference --      Peak Flow --      Pain Score 12/16/23 1119 0     Pain Loc --      Pain Education --      Exclude from Growth Chart --     Most recent vital signs: Vitals:   12/16/23 1120  BP: (!) 149/105  Pulse: (!) 102  Resp: 18  Temp: 98 F (36.7 C)  SpO2: 99%     General: Awake, interactive  CV:  Intermittent tachycardia with a regular rhythm, good peripheral perfusion Resp:  Unlabored respirations, clear to auscultation Abd:  Nondistended.  Neuro:  Symmetric facial movement, fluid speech   ED Results / Procedures /  Treatments   Labs (all labs ordered are listed, but only abnormal results are displayed) Labs Reviewed  BASIC METABOLIC PANEL WITH GFR - Abnormal; Notable for the following components:      Result Value   Glucose, Bld 119 (*)    All other components within normal limits  CBC  MAGNESIUM  TSH  T4, FREE  TROPONIN I (HIGH SENSITIVITY)  TROPONIN I (HIGH SENSITIVITY)     EKG EKG independently reviewed and interpreted by myself demonstrates:  EKG demonstrates A-fib at a rate of 117, QRS 80, QTc 457, no acute ST changes  RADIOLOGY Imaging independently reviewed and interpreted by myself demonstrates:  CXR without focal consolidation  Formal Radiology Read:  DG Chest 2 View Result Date: 12/16/2023 EXAM: 2 VIEW(S) XRAY OF THE CHEST 12/16/2023 11:44:00 AM COMPARISON: 04/22/2023 CLINICAL HISTORY: Afib. Pt comes with c/o palpitation that started 2 days ago. Pt state she thinks it might be the medicine. Pt states her heart feels like it is racing. Pt denies any cp or sob. FINDINGS: LUNGS AND PLEURA:  No focal pulmonary opacity. No pulmonary edema. No pleural effusion. No pneumothorax. HEART AND MEDIASTINUM: No acute abnormality of the cardiac and mediastinal silhouettes. BONES AND SOFT TISSUES: Multilevel degenerative changes of thoracic spine. No acute osseous abnormality. IMPRESSION: 1. No acute process. Electronically signed by: Donnice Mania MD 12/16/2023 11:57 AM EDT RP Workstation: [id number]    PROCEDURES:  Critical Care performed: No  Procedures   MEDICATIONS ORDERED IN ED: Medications  sodium chloride  0.9 % bolus 1,000 mL (1,000 mLs Intravenous New Bag/Given 12/16/23 1326)     IMPRESSION / MDM / ASSESSMENT AND PLAN / ED COURSE  I reviewed the triage vital signs and the nursing notes.  Differential diagnosis includes, but is not limited to, arrhythmia, anemia, electrolyte abnormality, ACS, stress mediated physiologic response  Patient's presentation is most consistent with acute  presentation with potential threat to life or bodily function.  58 year old female presenting to the emergency room for evaluation of palpitations.  Noted to in A-fib on presentation with intermittent tachycardia.  Labs obtained which were overall reassuring including CBC,'s BMP, normal mag, thyroid  studies.  Negative troponin x 2, low risk heart score.  EKG without acute ischemic findings.  Patient was given IV fluids here with improvement in her heart rate to the 80s to 90s primarily though she did remain in A-fib.  She symptomatically feels better.  I do see some studies suggesting that NSAIDs may increase A-fib risk, and with patient concerned that this has happened with her multiple times, do think it is reasonable to have her hold off on taking NSAIDs.  She does not have a history of diabetes, did discuss trialing a course of steroids for her knee pain.  With her improved heart rate, do think discharge is reasonable.  Patient comfortable with this plan.  Will place referral to cardiology for further evaluation and consideration of initiation of anticoagulation.  Strict return precautions provided.  Patient discharged in stable condition.      FINAL CLINICAL IMPRESSION(S) / ED DIAGNOSES   Final diagnoses:  Palpitations  Atrial fibrillation with rapid ventricular response (HCC)     Rx / DC Orders   ED Discharge Orders          Ordered    Ambulatory referral to Cardiology       Comments: If you have not heard from the Cardiology office within the next 72 hours please call [phone].   12/16/23 1510             Note:  This document was prepared using Dragon voice recognition software and may include unintentional dictation errors.   Levander Slate, MD 12/16/23 [phone]

## 2024-04-22 ENCOUNTER — Other Ambulatory Visit: Payer: Self-pay | Admitting: Family Medicine

## 2024-05-16 ENCOUNTER — Other Ambulatory Visit: Payer: Self-pay | Admitting: Family Medicine
